# Patient Record
Sex: Female | Born: 1945 | Race: White | Hispanic: No | Marital: Married | Smoking: Former smoker
Health system: Southern US, Community
[De-identification: ages and names within clinical notes are randomized; demographics above are authoritative.]

## PROBLEM LIST (undated history)

## (undated) DIAGNOSIS — T7840XA Allergy, unspecified, initial encounter: Secondary | ICD-10-CM

## (undated) DIAGNOSIS — I10 Essential (primary) hypertension: Secondary | ICD-10-CM

## (undated) DIAGNOSIS — C801 Malignant (primary) neoplasm, unspecified: Secondary | ICD-10-CM

## (undated) DIAGNOSIS — M1990 Unspecified osteoarthritis, unspecified site: Secondary | ICD-10-CM

## (undated) DIAGNOSIS — F419 Anxiety disorder, unspecified: Secondary | ICD-10-CM

## (undated) DIAGNOSIS — E785 Hyperlipidemia, unspecified: Secondary | ICD-10-CM

## (undated) HISTORY — DX: Allergy, unspecified, initial encounter: T78.40XA

## (undated) HISTORY — PX: JOINT REPLACEMENT: SHX530

## (undated) HISTORY — DX: Hyperlipidemia, unspecified: E78.5

## (undated) HISTORY — DX: Essential (primary) hypertension: I10

## (undated) HISTORY — DX: Anxiety disorder, unspecified: F41.9

## (undated) HISTORY — DX: Malignant (primary) neoplasm, unspecified: C80.1

## (undated) HISTORY — PX: ABDOMINAL HYSTERECTOMY: SHX81

## (undated) HISTORY — DX: Unspecified osteoarthritis, unspecified site: M19.90

## (undated) HISTORY — PX: WRIST SURGERY: SHX841

---

## 2012-11-26 DIAGNOSIS — E7800 Pure hypercholesterolemia, unspecified: Secondary | ICD-10-CM | POA: Insufficient documentation

## 2016-10-18 DIAGNOSIS — H9193 Unspecified hearing loss, bilateral: Secondary | ICD-10-CM | POA: Insufficient documentation

## 2016-12-06 ENCOUNTER — Encounter: Payer: Self-pay | Admitting: Student in an Organized Health Care Education/Training Program

## 2016-12-06 ENCOUNTER — Ambulatory Visit
Admission: RE | Admit: 2016-12-06 | Discharge: 2016-12-06 | Disposition: A | Discharge status: Home / Self Care | Payer: Medicare HMO | Source: Ambulatory Visit | Attending: Student in an Organized Health Care Education/Training Program | Admitting: Student in an Organized Health Care Education/Training Program

## 2016-12-06 ENCOUNTER — Ambulatory Visit
Payer: Medicare HMO | Attending: Student in an Organized Health Care Education/Training Program | Admitting: Student in an Organized Health Care Education/Training Program

## 2016-12-06 VITALS — BP 137/95 | HR 70 | Temp 98.1°F | Resp 16 | Ht 64.0 in | Wt 220.0 lb

## 2016-12-06 DIAGNOSIS — M47814 Spondylosis without myelopathy or radiculopathy, thoracic region: Secondary | ICD-10-CM | POA: Diagnosis not present

## 2016-12-06 DIAGNOSIS — M170 Bilateral primary osteoarthritis of knee: Secondary | ICD-10-CM | POA: Diagnosis not present

## 2016-12-06 DIAGNOSIS — M47816 Spondylosis without myelopathy or radiculopathy, lumbar region: Secondary | ICD-10-CM

## 2016-12-06 DIAGNOSIS — I700 Atherosclerosis of aorta: Secondary | ICD-10-CM | POA: Insufficient documentation

## 2016-12-06 DIAGNOSIS — F329 Major depressive disorder, single episode, unspecified: Secondary | ICD-10-CM | POA: Insufficient documentation

## 2016-12-06 DIAGNOSIS — M7918 Myalgia, other site: Secondary | ICD-10-CM

## 2016-12-06 DIAGNOSIS — G894 Chronic pain syndrome: Secondary | ICD-10-CM | POA: Diagnosis present

## 2016-12-06 DIAGNOSIS — K219 Gastro-esophageal reflux disease without esophagitis: Secondary | ICD-10-CM | POA: Insufficient documentation

## 2016-12-06 DIAGNOSIS — M47812 Spondylosis without myelopathy or radiculopathy, cervical region: Secondary | ICD-10-CM | POA: Diagnosis not present

## 2016-12-06 DIAGNOSIS — Z888 Allergy status to other drugs, medicaments and biological substances status: Secondary | ICD-10-CM | POA: Diagnosis not present

## 2016-12-06 DIAGNOSIS — Z79899 Other long term (current) drug therapy: Secondary | ICD-10-CM | POA: Insufficient documentation

## 2016-12-06 DIAGNOSIS — Z87891 Personal history of nicotine dependence: Secondary | ICD-10-CM | POA: Diagnosis not present

## 2016-12-06 DIAGNOSIS — Z90710 Acquired absence of both cervix and uterus: Secondary | ICD-10-CM | POA: Diagnosis not present

## 2016-12-06 DIAGNOSIS — F411 Generalized anxiety disorder: Secondary | ICD-10-CM

## 2016-12-06 DIAGNOSIS — Z96653 Presence of artificial knee joint, bilateral: Secondary | ICD-10-CM | POA: Diagnosis not present

## 2016-12-06 DIAGNOSIS — E785 Hyperlipidemia, unspecified: Secondary | ICD-10-CM | POA: Insufficient documentation

## 2016-12-06 DIAGNOSIS — I129 Hypertensive chronic kidney disease with stage 1 through stage 4 chronic kidney disease, or unspecified chronic kidney disease: Secondary | ICD-10-CM | POA: Diagnosis not present

## 2016-12-06 DIAGNOSIS — Z9889 Other specified postprocedural states: Secondary | ICD-10-CM | POA: Diagnosis not present

## 2016-12-06 DIAGNOSIS — M791 Myalgia: Secondary | ICD-10-CM | POA: Insufficient documentation

## 2016-12-06 MED ORDER — TIZANIDINE HCL 4 MG PO CAPS: 4.0000 mg | ORAL_CAPSULE | Freq: Two times a day (BID) | ORAL | 0 refills | Status: DC | PRN

## 2016-12-06 NOTE — Patient Instructions (Signed)
It was nice meeting you today. Today we did the following:  #1 referral to pain psychology which is standard for new patients #2 urine drug screen today which is tender for new patients #3 scheduled for bilateral genicular nerves block #4 prescription for tizanidine 4 mg twice a day when necessary muscle spasms #5 continue Celebrex 200 mg daily, continue gabapentin 300 mg 3 times a day. #6 we will order cervical, thoracic, lumbar x-rays #7 follow with me within the next month for your genicular nerves block. Follow up with me regarding medication management after your pain psychology evaluation.  As we discussed, if you want to be considered for opioid therapy at this clinic under my care, you cannot take any benzodiazepines or medication such as Soma. This would be grounds to have your opioid therapy weaned and discontinued if you continue this medication. Therefore I prescribed to another medication called tizanidine which may help out with your muscle spasms.

## 2016-12-06 NOTE — Progress Notes (Signed)
Safety precautions to be maintained throughout the outpatient stay will include: orient to surroundings, keep bed in low position, maintain call bell within reach at all times, provide assistance with transfer out of bed and ambulation.  

## 2016-12-06 NOTE — Progress Notes (Signed)
Patient's Name: Cindy Rose  MRN: [id number]  Referring Provider: No ref. provider found  DOB: [birth year]  PCP: Patient, No Pcp Per  DOS: 12/06/2016  Note by: Gillis Santa, MD  Service setting: Ambulatory outpatient  Specialty: Interventional Pain Management  Location: [facility] (AMB) Pain Management Facility  Visit type: Initial Patient Evaluation  Patient type: New Patient   Primary Reason(s) for Visit: Encounter for initial evaluation of one or more chronic problems (new to examiner) potentially causing chronic pain, and posing a threat to normal musculoskeletal function. (Level of risk: High) CC: Knee Pain (bilateral) and Back Pain (lower- bilaterally to buttocks)  HPI  Cindy Rose is a 71 y.o. year old, female patient, who comes today to see [place] for the first time for an initial evaluation of her chronic pain. She  does not have a problem list on file. Today she comes in for evaluation of her Knee Pain (bilateral) and Back Pain (lower- bilaterally to buttocks)  Pain Assessment: Location: Right, Left Knee Radiating: to shin Onset: More than a month ago (had replacent 10 years ago) Duration: Chronic pain Quality: Aching, Stabbing Severity: 7 /10 (self-reported pain score)  Note: Reported level is compatible with observation.                   Effect on ADL: "I dont what I need too" Timing: Constant Modifying factors: rest, medications, heat, cold  Onset and Duration: Gradual and Present longer than 3 months Cause of pain: worked too hard Severity: Getting worse, NAS-11 at its worse: 10/10, NAS-11 at its best: 5/10, NAS-11 now: 7/10 and NAS-11 on the average: 7/10 Timing: During activity or exercise, After activity or exercise and After a period of immobility Aggravating Factors: Climbing, Kneeling, Prolonged sitting, Prolonged standing, Squatting, Surgery made it worse, Walking, Walking uphill, Walking downhill and Working Alleviating Factors: Hot packs and Medications Associated  Problems: Numbness, Spasms, Swelling, Tingling and Weakness Quality of Pain: Aching, Burning, Feeling of constriction, Getting longer, Horrible, Pressure-like, Throbbing and Tingling Previous Examinations or Tests: X-rays and Orthopedic evaluation Previous Treatments: Narcotic medications and Trigger point injections  The patient comes into the clinics today for the first time for a chronic pain management evaluation.  71 y.o. old female with a past medical history significant for bilateral knee replacements who presents with bilateral knee pain. Patient states that she has had bilateral knee replacements done in [place] many years ago, left one was done in 2008 and right one was done in 2007. She states that these knee replacements were done "incorrectly". She states that her knees are misaligned as a result of the surgery.  In addition to her knees the patient also endorses diffuse pain in her bilateral shoulders, hands however endorses that her knee pain is the most debilitating. She works as a Programme researcher, broadcasting/film/video.  Patient does take Soma for muscle spasms. I told her that to be considered for opioid therapy, she must refrain from this medication or benzodiazepines. Patient does have anxiety and depression. She was tearful at times during our conversation.  Today I took the time to provide the patient with information regarding my pain practice. The patient was informed that my practice is divided into two sections: an interventional pain management section, as well as a completely separate and distinct medication management section. I explained that I have procedure days for my interventional therapies, and evaluation days for follow-ups and medication management. Because of the amount of documentation required during both, they are kept separated. This means  that there is the possibility that she may be scheduled for a procedure on one day, and medication management the next. I have also informed her that  because of staffing and facility limitations, I no longer take patients for medication management only. To illustrate the reasons for this, I gave the patient the example of surgeons, and how inappropriate it would be to refer a patient to his/her care, just to write for the post-surgical antibiotics on a surgery done by a different surgeon.   Because interventional pain management is my board-certified specialty, the patient was informed that joining my practice means that they are open to any and all interventional therapies. I made it clear that this does not mean that they will be forced to have any procedures done. What this means is that I believe interventional therapies to be essential part of the diagnosis and proper management of chronic pain conditions. Therefore, patients not interested in these interventional alternatives will be better served under the care of a different practitioner.  The patient was also made aware of my Comprehensive Pain Management Safety Guidelines where by joining my practice, they limit all of their nerve blocks and joint injections to those done by our practice, for as long as we are retained to manage their care.   Historic Controlled Substance Pharmacotherapy Review  PMP and historical list of controlled substances: Norco, 7.5, twice a day to 3 times a day when necessary, last fill quantity 14, 10/26/2016 MME/day: 0 mg/day Medications: The patient did not bring the medication(s) to the appointment, as requested in our "New Patient Package" Pharmacodynamics: Desired effects: Analgesia: The patient reports >50% benefit. Reported improvement in function: The patient reports medication allows her to accomplish basic ADLs. Clinically meaningful improvement in function (CMIF): Sustained CMIF goals met Perceived effectiveness: Described as relatively effective, allowing for increase in activities of daily living (ADL) Undesirable effects: Side-effects or Adverse  reactions: None reported Historical Monitoring: The patient  reports that she does not use drugs. List of all UDS Test(s): No results found for: MDMA, COCAINSCRNUR, PCPSCRNUR, PCPQUANT, CANNABQUANT, THCU, [place] List of all Serum Drug Screening Test(s):  No results found for: AMPHSCRSER, BARBSCRSER, BENZOSCRSER, COCAINSCRSER, PCPSCRSER, PCPQUANT, THCSCRSER, CANNABQUANT, OPIATESCRSER, OXYSCRSER, PROPOXSCRSER Historical Background Evaluation: [place] PDMP: Six (6) year initial data search conducted.             [place] Department of public safety, offender search: Editor, commissioning Information) Non-contributory Risk Assessment Profile: Aberrant behavior: None observed or detected today Risk factors for fatal opioid overdose: None identified today Fatal overdose hazard ratio (HR): Calculation deferred Non-fatal overdose hazard ratio (HR): Calculation deferred Risk of opioid abuse or dependence: 0.7-3.0% with doses ? 36 MME/day and 6.1-26% with doses ? 120 MME/day. Substance use disorder (SUD) risk level: Pending results of Medical Psychology Evaluation for SUD Opioid risk tool (ORT) (Total Score): 2     Opioid Risk Tool - 12/06/16 1426      Family History of Substance Abuse   Alcohol Negative   Illegal Drugs Negative   Rx Drugs Negative     Personal History of Substance Abuse   Alcohol Negative   Illegal Drugs Negative   Rx Drugs Negative     Age   Age between 85-45 years  No     Psychological Disease   Psychological Disease Positive   ADD Positive  No med- states she has it   OCD Negative   Bipolar Negative   Schizophrenia Negative   Depression Negative  Total Score   Opioid Risk Tool Scoring 2   Opioid Risk Interpretation Low Risk     ORT Scoring interpretation table:  Score <3 = Low Risk for SUD  Score between 4-7 = Moderate Risk for SUD  Score >8 = High Risk for Opioid Abuse    Pharmacologic Plan: Pending ordered tests and/or consults            Initial impression: Pending review  of available data and ordered tests.  Meds   Current Meds  Medication Sig  . CALCIUM-MAGNESIUM-ZINC PO Take by mouth.  . celecoxib (CELEBREX) 200 MG capsule TAKE ONE CAPSULE BY MOUTH ONCE DAILY  . gabapentin (NEURONTIN) 300 MG capsule Take 300 mg by mouth 2 (two) times daily.  Marland Kitchen HYDROcodone-acetaminophen (NORCO) 7.5-325 MG tablet Take by mouth.  . naproxen (NAPROSYN) 500 MG tablet Take by mouth.  Marland Kitchen omeprazole (PRILOSEC) 40 MG capsule TAKE ONE CAPSULE BY MOUTH ONCE DAILY  . valsartan-hydrochlorothiazide (DIOVAN-HCT) 160-12.5 MG tablet TAKE ONE TABLET BY MOUTH IN THE EVENING  . [DISCONTINUED] carisoprodol (SOMA) 350 MG tablet TAKE 1 TABLET BY MOUTH EVERY 8 HOURS AS NEEDED    Imaging Review   TECHNIQUE: Two views left knee.  COMPARISON: None.  INDICATION: T84.84XA: Pain due to internal orthopedic prosthetic devices, implants and grafts, initial encounter Z96.659: Presence of unspecified artificial knee joint  FINDINGS:  No acute fracture. Total knee arthroplasty intact. Slight inferior tilt of the medial tibial plateau. Small effusion.  Result Narrative  TECHNIQUE: 2 views right knee.  COMPARISON: None  FINDINGS: Total knee prosthesis is seen. Slight tilt of the patellar component. Tibiofemoral components are unremarkable. The tibial component is secured with screws. May be a small effusion superiorly.  Total knee replacement. Slight tilt of the patella prosthesis, with could be related to an effusion.  Other Result Information  Acute Interface, Incoming Rad Results - 03/24/2015 10:42 AM EST TECHNIQUE: 2 views right knee.  COMPARISON: None  FINDINGS: Total knee prosthesis is seen. Slight tilt of the patellar component. Tibiofemoral components are unremarkable. The tibial component is secured with screws. May be a small effusion superiorly.  Total knee replacement. Slight tilt of the patella prosthesis, with could be related to an effusion.      Complexity Note:  Imaging results reviewed. Results discussed using Layman's terms.               ROS  Cardiovascular History: High blood pressure Pulmonary or Respiratory History: Snoring  Neurological History: No reported neurological signs or symptoms such as seizures, abnormal skin sensations, urinary and/or fecal incontinence, being born with an abnormal open spine and/or a tethered spinal cord Review of Past Neurological Studies: No results found for this or any previous visit. Psychological-Psychiatric History: No reported psychological or psychiatric signs or symptoms such as difficulty sleeping, anxiety, depression, delusions or hallucinations (schizophrenial), mood swings (bipolar disorders) or suicidal ideations or attempts Gastrointestinal History: Reflux or heatburn Genitourinary History: No reported renal or genitourinary signs or symptoms such as difficulty voiding or producing urine, peeing blood, non-functioning kidney, kidney stones, difficulty emptying the bladder, difficulty controlling the flow of urine, or chronic kidney disease Hematological History: No reported hematological signs or symptoms such as prolonged bleeding, low or poor functioning platelets, bruising or bleeding easily, hereditary bleeding problems, low energy levels due to low hemoglobin or being anemic Endocrine History: No reported endocrine signs or symptoms such as high or low blood sugar, rapid heart rate due to high thyroid levels, obesity or weight gain  due to slow thyroid or thyroid disease Rheumatologic History: No reported rheumatological signs and symptoms such as fatigue, joint pain, tenderness, swelling, redness, heat, stiffness, decreased range of motion, with or without associated rash Musculoskeletal History: Negative for myasthenia gravis, muscular dystrophy, multiple sclerosis or malignant hyperthermia Work History: Working full time  Allergies  Ms. Gordner is allergic to tape.  Laboratory Chemistry   Inflammation Markers (CRP: Acute Phase) (ESR: Chronic Phase) No results found for: CRP, ESRSEDRATE               Renal Function Markers No results found for: BUN, CREATININE, GFRAA, GFRNONAA               Hepatic Function Markers No results found for: AST, ALT, ALBUMIN, ALKPHOS, HCVAB               Electrolytes No results found for: NA, K, CL, CALCIUM, MG               Neuropathy Markers No results found for: [id number]               Bone Pathology Markers No results found for: Hendricks Milo, VD125OH2TOT, [id number], [id number], 25OHVITD1, 25OHVITD2, 25OHVITD3, CALCIUM, TESTOFREE, TESTOSTERONE               Coagulation Parameters No results found for: INR, LABPROT, APTT, PLT               Cardiovascular Markers No results found for: BNP, HGB, HCT               Note: Lab results reviewed.  PFSH  Drug: Ms. Borenstein  reports that she does not use drugs. Alcohol:  reports that she does not drink alcohol. Tobacco:  reports that she quit smoking about 10 years ago. Her smoking use included Cigarettes. She has never used smokeless tobacco. Medical:  has a past medical history of Allergy; Anxiety; Arthritis; Hyperlipidemia; and Hypertension. Family: family history includes Cancer in her father; Heart disease in her mother.  Past Surgical History:  Procedure Laterality Date  . ABDOMINAL HYSTERECTOMY    . JOINT REPLACEMENT    . WRIST SURGERY     Active Ambulatory Problems    Diagnosis Date Noted  . No Active Ambulatory Problems   Resolved Ambulatory Problems    Diagnosis Date Noted  . No Resolved Ambulatory Problems   Past Medical History:  Diagnosis Date  . Allergy   . Anxiety   . Arthritis   . Hyperlipidemia   . Hypertension    Constitutional Exam  General appearance: Well nourished, well developed, and well hydrated. In no apparent acute distress Vitals:   12/06/16 1400  BP: (!) 137/95  Pulse: 70  Resp: 16  Temp: 98.1 F (36.7 C)  SpO2: 100%  Weight: 220  lb (99.8 kg)  Height: 5' 4"  (1.626 m)   BMI Assessment: Estimated body mass index is 37.76 kg/m as calculated from the following:   Height as of this encounter: 5' 4"  (1.626 m).   Weight as of this encounter: 220 lb (99.8 kg).  BMI interpretation table: BMI level Category Range association with higher incidence of chronic pain  <18 kg/m2 Underweight   18.5-24.9 kg/m2 Ideal body weight   25-29.9 kg/m2 Overweight Increased incidence by 20%  30-34.9 kg/m2 Obese (Class I) Increased incidence by 68%  35-39.9 kg/m2 Severe obesity (Class II) Increased incidence by 136%  >40 kg/m2 Extreme obesity (Class III) Increased incidence by 254%   BMI Readings from Last  4 Encounters:  12/06/16 37.76 kg/m   Wt Readings from Last 4 Encounters:  12/06/16 220 lb (99.8 kg)  Psych/Mental status: Alert, oriented x 3 (person, place, & time)       Eyes: PERLA Respiratory: No evidence of acute respiratory distress  Cervical Spine Area Exam  Skin & Axial Inspection: No masses, redness, edema, swelling, or associated skin lesions Alignment: Symmetrical Functional ROM: Unrestricted ROM      Stability: No instability detected Muscle Tone/Strength: Functionally intact. No obvious neuro-muscular anomalies detected. Sensory (Neurological): Unimpaired Palpation: No palpable anomalies              Upper Extremity (UE) Exam    Side: Right upper extremity  Side: Left upper extremity  Skin & Extremity Inspection: Skin color, temperature, and hair growth are WNL. No peripheral edema or cyanosis. No masses, redness, swelling, asymmetry, or associated skin lesions. No contractures.  Skin & Extremity Inspection: Skin color, temperature, and hair growth are WNL. No peripheral edema or cyanosis. No masses, redness, swelling, asymmetry, or associated skin lesions. No contractures.  Functional ROM: Unrestricted ROM          Functional ROM: Unrestricted ROM          Muscle Tone/Strength: Functionally intact. No obvious  neuro-muscular anomalies detected.  Muscle Tone/Strength: Functionally intact. No obvious neuro-muscular anomalies detected.  Sensory (Neurological): Unimpaired  Sensory (Neurological): Unimpaired  Palpation: No palpable anomalies              Palpation: No palpable anomalies              Specialized Test(s): Deferred         Specialized Test(s): Deferred          Thoracic Spine Area Exam  Skin & Axial Inspection: No masses, redness, or swelling Alignment: Symmetrical Functional ROM: Unrestricted ROM Stability: No instability detected Muscle Tone/Strength: Functionally intact. No obvious neuro-muscular anomalies detected. Sensory (Neurological): Unimpaired Muscle strength & Tone: No palpable anomalies  Lumbar Spine Area Exam  Skin & Axial Inspection: No masses, redness, or swelling Alignment: Symmetrical Functional ROM: Unrestricted ROM      Stability: No instability detected Muscle Tone/Strength: Functionally intact. No obvious neuro-muscular anomalies detected. Sensory (Neurological): Movement-associated pain Palpation: Positive       Provocative Tests: Lumbar Hyperextension and rotation test: Positive       Lumbar Lateral bending test: Positive       Patrick's Maneuver: Positive                    Gait & Posture Assessment  Ambulation: Unassisted Gait: Relatively normal for age and body habitus Posture: WNL   Lower Extremity Exam    Side: Right lower extremity  Side: Left lower extremity  Skin & Extremity Inspection: Evidence of prior arthroplastic surgery  Skin & Extremity Inspection: Evidence of prior arthroplastic surgery  Functional ROM: Decreased ROM          Functional ROM: Decreased ROM          Muscle Tone/Strength: Functionally intact. No obvious neuro-muscular anomalies detected.  Muscle Tone/Strength: Functionally intact. No obvious neuro-muscular anomalies detected.  Sensory (Neurological): Arthropathic arthralgia  Sensory (Neurological): Arthropathic arthralgia   Palpation: Complains of area being tender to palpation  Palpation: Complains of area being tender to palpation  Limited knee flexion, knee extension bilaterally limited by pain. Medial portion of the patella tender to palpation on right. Medial inferior patellar region tender to palpation.   Assessment  Primary Diagnosis &  Pertinent Problem List: The primary encounter diagnosis was Primary osteoarthritis of both knees. Diagnoses of History of bilateral knee replacement, Anxiety state, Myofascial pain, Chronic pain syndrome, and Lumbar spondylosis were also pertinent to this visit.  Visit Diagnosis (New problems to examiner): 1. Primary osteoarthritis of both knees   2. History of bilateral knee replacement   3. Anxiety state   4. Myofascial pain   5. Chronic pain syndrome   6. Lumbar spondylosis    Plan of Care (Initial workup plan)  Note: Please be advised that as per protocol, today's visit has been an evaluation only. We have not taken over the patient's controlled substance management.   71 year old female with a past medical history of depression, anxiety, chronic pain syndrome secondary to primary osteoarthritis of bilateral knees status post bilateral knee replacement therapy. Patient also endorses axial low back pain which is not as bad as her knee pain. Also endorses bilateral shoulder and hand pain.  Lumbar spine x-ray shows degenerative disc disease and multilevel spondylosis.  I had an extensive discussion about discontinuing Soma with the patient. I told her that if she is to be considered for chronic opioid therapy at this clinic, she must be off of benzodiazepines or medication such as Soma. Patient agrees. I expect her urine toxicology screen be positive for Soma today. Repeat urine toxicology screen at next visit should be negative for Soma.  We'll send the patient to pain psychology for risk of chronic opioid therapy. I will also schedule the patient for bilateral  genicular nerve block under fluoroscopy for her bilateral osteoarthritis of knees.    plan: #1. Continue Celebrex 200 mg daily and gabapentin 300 mg 3 times a day as you have been taking in the past. #2. Pain psychology referral. Urine drug scan today. Should be positive for Soma. Patient states that she will stop Soma. Future urine drug screen should be negative for stroke. #3. Scheduled for bilateral genicular nerves block under fluoroscopy. Patient has had intra-articular knee steroid injections which were not very effective. #4. tizanidine 4 mg twice a day when necessary muscle spasms.  #5. Follow with me for medication management after pain psych evaluation. #6. Cervical thoracic lumbar x-rays.    Ordered Lab-work, Procedure(s), Referral(s), & Consult(s): Orders Placed This Encounter  Procedures  . GENICULAR NERVE BLOCK  . DG Cervical Spine Complete  . DG Thoracic Spine 2 View  . DG Lumbar Spine Complete W/Bend  . Compliance Drug Analysis, Ur  . Ambulatory referral to Psychology   Pharmacotherapy (current): Medications ordered:  Meds ordered this encounter  Medications  . tiZANidine (ZANAFLEX) 4 MG capsule    Sig: Take 1 capsule (4 mg total) by mouth 2 (two) times daily as needed for muscle spasms.    Dispense:  60 capsule    Refill:  0    Do not place this medication, or any other prescription from our practice, on "Automatic Refill". Patient may have prescription filled one day early if pharmacy is closed on scheduled refill date.   Medications administered during this visit: Ms. Rutten had no medications administered during this visit.   Pharmacological management options:  Opioid Analgesics: The patient was informed that there is no guarantee that she would be a candidate for opioid analgesics. The decision will be made following CDC guidelines. This decision will be based on the results of diagnostic studies, as well as Ms. Severe's risk profile.   Membrane  stabilizer: To be determined at a later timeCurrently on gabapentin  300 mg 3 times a day. Can consider going up. Could also trial Lyrica or Topamax.   Muscle relaxant: To be determined at a later timeTrial of tizanidine. Can consider Robaxin, Flexeril, baclofen   NSAID: To be determined at a later timeCurrently on Celebrex. Can consider diclofenac by mouth or Mobic by mouth in the future.   Other analgesic(s): To be determined at a later timeLidocaine ointment, Voltaren gel, Cymbalta, TCA    Interventional management options: Ms. Larsson was informed that there is no guarantee that she would be a candidate for interventional therapies. The decision will be based on the results of diagnostic studies, as well as Ms. Lees's risk profile.  Procedure(s) under consideration:  -Bilateral genicular nerves block -Intra-articular knee steroid injection -Lumbar medial branch blocks, L3-L5 bilaterally -Shoulder joint injection -SI joint injection    Provider-requested follow-up: No Follow-up on file.  No future appointments.  Primary Care Physician: Patient, No Pcp Per Location: [facility] Note by: Gillis Santa, M.D, Date: 12/06/2016; Time: 12:04 PM  Patient Instructions  It was nice meeting you today. Today we did the following:  #1 referral to pain psychology which is standard for new patients #2 urine drug screen today which is tender for new patients #3 scheduled for bilateral genicular nerves block #4 prescription for tizanidine 4 mg twice a day when necessary muscle spasms #5 continue Celebrex 200 mg daily, continue gabapentin 300 mg 3 times a day. #6 we will order cervical, thoracic, lumbar x-rays #7 follow with me within the next month for your genicular nerves block. Follow up with me regarding medication management after your pain psychology evaluation.  As we discussed, if you want to be considered for opioid therapy at this clinic under my care,  you cannot take any benzodiazepines or medication such as Soma. This would be grounds to have your opioid therapy weaned and discontinued if you continue this medication. Therefore I prescribed to another medication called tizanidine which may help out with your muscle spasms.

## 2016-12-10 LAB — COMPLIANCE DRUG ANALYSIS, UR

## 2017-01-04 ENCOUNTER — Ambulatory Visit (HOSPITAL_BASED_OUTPATIENT_CLINIC_OR_DEPARTMENT_OTHER): Payer: Medicare HMO | Admitting: Student in an Organized Health Care Education/Training Program

## 2017-01-04 ENCOUNTER — Encounter: Payer: Self-pay | Admitting: Student in an Organized Health Care Education/Training Program

## 2017-01-04 ENCOUNTER — Ambulatory Visit
Admission: RE | Admit: 2017-01-04 | Discharge: 2017-01-04 | Disposition: A | Discharge status: Home / Self Care | Payer: Medicare HMO | Source: Ambulatory Visit | Attending: Student in an Organized Health Care Education/Training Program | Admitting: Student in an Organized Health Care Education/Training Program

## 2017-01-04 VITALS — BP 117/67 | HR 68 | Temp 98.7°F | Resp 19 | Ht 64.0 in | Wt 220.0 lb

## 2017-01-04 DIAGNOSIS — Z90710 Acquired absence of both cervix and uterus: Secondary | ICD-10-CM | POA: Diagnosis not present

## 2017-01-04 DIAGNOSIS — G894 Chronic pain syndrome: Secondary | ICD-10-CM

## 2017-01-04 DIAGNOSIS — M170 Bilateral primary osteoarthritis of knee: Secondary | ICD-10-CM

## 2017-01-04 DIAGNOSIS — Z91048 Other nonmedicinal substance allergy status: Secondary | ICD-10-CM | POA: Diagnosis not present

## 2017-01-04 DIAGNOSIS — Z9660 Presence of unspecified orthopedic joint implant: Secondary | ICD-10-CM | POA: Insufficient documentation

## 2017-01-04 DIAGNOSIS — Z79899 Other long term (current) drug therapy: Secondary | ICD-10-CM | POA: Diagnosis not present

## 2017-01-04 MED ORDER — LIDOCAINE HCL (PF) 1 % IJ SOLN: 10.0000 mL | Freq: Once | INTRAMUSCULAR | Status: DC

## 2017-01-04 MED ORDER — BACLOFEN 10 MG PO TABS: 10.0000 mg | ORAL_TABLET | Freq: Three times a day (TID) | ORAL | 1 refills | Status: DC | PRN

## 2017-01-04 MED ORDER — LIDOCAINE HCL (PF) 1.5 % IJ SOLN
20.0000 mL | Freq: Once | INTRAMUSCULAR | Status: DC
  Filled 2017-01-04: qty 20

## 2017-01-04 MED ORDER — ROPIVACAINE HCL 2 MG/ML IJ SOLN
9.0000 mL | Freq: Once | INTRAMUSCULAR | Status: AC
  Administered 2017-01-04: 10 mL
  Filled 2017-01-04: qty 10

## 2017-01-04 NOTE — Progress Notes (Signed)
Safety precautions to be maintained throughout the outpatient stay will include: orient to surroundings, keep bed in low position, maintain call bell within reach at all times, provide assistance with transfer out of bed and ambulation.  

## 2017-01-04 NOTE — Patient Instructions (Signed)

## 2017-01-04 NOTE — Progress Notes (Signed)
Patient's Name: Cindy Rose  MRN: [id number]  Referring Provider: Gillis Santa, MD  DOB: [birth year]  PCP: Patient, No Pcp Per  DOS: 01/04/2017  Note by: Gillis Santa, MD  Service setting: Ambulatory outpatient  Specialty: Interventional Pain Management  Patient type: Established  Location: [facility] (AMB) Pain Management Facility  Visit type: Interventional Procedure   Primary Reason for Visit: Interventional Pain Management Treatment. CC: Knee Pain (bilateral)  Procedure:  Anesthesia, Analgesia, Anxiolysis:  Type: Therapeutic Superior-lateral, Superior-medial, and Inferior-medial, Genicular Nerves Block. (CPT [phone]) Region: Lateral, Anterior, and Medial aspects of the knee joint, above and below the knee joint proper. Level: Superior and inferior to the knee joint. Laterality: Bilateral  Type: Local Anesthesia Local Anesthetic: Lidocaine 1% Route: Infiltration ([place]/IM) IV Access: Secured Sedation: Meaningful verbal contact was maintained at all times during the procedure  Indication(s): Analgesia and Anxiety  Indications: 1. Primary osteoarthritis of both knees    Pain Score: Pre-procedure: 6 /10 Post-procedure: 0-No pain/10  Pre-op Assessment:  Cindy Rose is a 71 y.o. (year old), female patient, seen today for interventional treatment. She  has a past surgical history that includes Joint replacement; Abdominal hysterectomy; and Wrist surgery. Cindy Rose has a current medication list which includes the following prescription(s): calcium-magnesium-zinc, celecoxib, gabapentin, hydrocodone-acetaminophen, naproxen, omeprazole, valsartan-hydrochlorothiazide, and baclofen, and the following Facility-Administered Medications: lidocaine. Her primarily concern today is the Knee Pain (bilateral)  Initial Vital Signs: There were no vitals taken for this visit. BMI: Estimated body mass index is 37.76 kg/m as calculated from the following:   Height as of this encounter: 5\' 4"  (1.626 m).    Weight as of this encounter: 220 lb (99.8 kg).  Risk Assessment: Allergies: Reviewed. She is allergic to tape.  Allergy Precautions: None required Coagulopathies: Reviewed. None identified.  Blood-thinner therapy: None at this time Active Infection(s): Reviewed. None identified. Cindy Rose is afebrile  Site Confirmation: Cindy Rose was asked to confirm the procedure and laterality before marking the site Procedure checklist: Completed Consent: Before the procedure and under the influence of no sedative(s), amnesic(s), or anxiolytics, the patient was informed of the treatment options, risks and possible complications. To fulfill our ethical and legal obligations, as recommended by the American Medical Association's Code of Ethics, I have informed the patient of my clinical impression; the nature and purpose of the treatment or procedure; the risks, benefits, and possible complications of the intervention; the alternatives, including doing nothing; the risk(s) and benefit(s) of the alternative treatment(s) or procedure(s); and the risk(s) and benefit(s) of doing nothing. The patient was provided information about the general risks and possible complications associated with the procedure. These may include, but are not limited to: failure to achieve desired goals, infection, bleeding, organ or nerve damage, allergic reactions, paralysis, and death. In addition, the patient was informed of those risks and complications associated to the procedure, such as failure to decrease pain; infection; bleeding; organ or nerve damage with subsequent damage to sensory, motor, and/or autonomic systems, resulting in permanent pain, numbness, and/or weakness of one or several areas of the body; allergic reactions; (i.e.: anaphylactic reaction); and/or death. Furthermore, the patient was informed of those risks and complications associated with the medications. These include, but are not limited to: allergic  reactions (i.e.: anaphylactic or anaphylactoid reaction(s)); adrenal axis suppression; blood sugar elevation that in diabetics may result in ketoacidosis or comma; water retention that in patients with history of congestive heart failure may result in shortness of breath, pulmonary edema, and decompensation with resultant heart failure;  weight gain; swelling or edema; medication-induced neural toxicity; particulate matter embolism and blood vessel occlusion with resultant organ, and/or nervous system infarction; and/or aseptic necrosis of one or more joints. Finally, the patient was informed that Medicine is not an exact science; therefore, there is also the possibility of unforeseen or unpredictable risks and/or possible complications that may result in a catastrophic outcome. The patient indicated having understood very clearly. We have given the patient no guarantees and we have made no promises. Enough time was given to the patient to ask questions, all of which were answered to the patient's satisfaction. Cindy Rose has indicated that she wanted to continue with the procedure. Attestation: I, the ordering provider, attest that I have discussed with the patient the benefits, risks, side-effects, alternatives, likelihood of achieving goals, and potential problems during recovery for the procedure that I have provided informed consent. Date: 01/04/2017; Time: 1:21 PM  Pre-Procedure Preparation:  Monitoring: As per clinic protocol. Respiration, ETCO2, SpO2, BP, heart rate and rhythm monitor placed and checked for adequate function Safety Precautions: Patient was assessed for positional comfort and pressure points before starting the procedure. Time-out: I initiated and conducted the "Time-out" before starting the procedure, as per protocol. The patient was asked to participate by confirming the accuracy of the "Time Out" information. Verification of the correct person, site, and procedure were performed  and confirmed by me, the nursing staff, and the patient. "Time-out" conducted as per Joint Commission's Universal Protocol (UP.01.01.01). "Time-out" Date & Time: 01/04/2017; 1354 hrs.  Description of Procedure Process:  Position: Supine Target Area: For Genicular Nerve block(s), the targets are: the superior-lateral genicular nerve, located in the lateral distal portion of the femoral shaft as it curves to form the lateral epicondyle, in the region of the distal femoral metaphysis; the superior-medial genicular nerve, located in the medial distal portion of the femoral shaft as it curves to form the medial epicondyle; and the inferior-medial genicular nerve, located in the medial, proximal portion of the tibial shaft, as it curves to form the medial epicondyle, in the region of the proximal tibial metaphysis. Approach: Anterior, ipsilateral approach. Area Prepped: Entire knee area, from mid-thigh to mid-shin, lateral, anterior, and medial aspects. Prepping solution: ChloraPrep (2% chlorhexidine gluconate and 70% isopropyl alcohol) Safety Precautions: Aspiration looking for blood return was conducted prior to all injections. At no point did we inject any substances, as a needle was being advanced. No attempts were made at seeking any paresthesias. Safe injection practices and needle disposal techniques used. Medications properly checked for expiration dates. SDV (single dose vial) medications used. Latex Allergy precautions taken.   Description of the Procedure: Protocol guidelines were followed. The patient was placed in position over the procedure table. The target area was identified and the area prepped in the usual manner. Skin desensitized using vapocoolant spray. Skin & deeper tissues infiltrated with local anesthetic. Appropriate amount of time allowed to pass for local anesthetics to take effect. The procedure needles were then advanced to the target area. Proper needle placement secured. Negative  aspiration confirmed. Solution injected in intermittent fashion, asking for systemic symptoms every 0.5cc of injectate. The needles were then removed and the area cleansed, making sure to leave some of the prepping solution back to take advantage of its long term bactericidal properties. Vitals:   01/04/17 1410 01/04/17 1415 01/04/17 1420 01/04/17 1424  BP: 127/71 133/75 135/62 117/67  Pulse:      Resp: 18 16 17 19   Temp:  TempSrc:      SpO2: 100% 100% 100% 95%  Weight:      Height:        Start Time: 1356 hrs. End Time: 1421 hrs. Materials:  Needle(s) Type: Regular needle Gauge: 22G Length: 3.5-in Medication(s): We administered ropivacaine (PF) 2 mg/mL (0.2%). Please see chart orders for dosing details. 1 cc of 0.2% ropivacaine at each level. Imaging Guidance (Non-Spinal):  Type of Imaging Technique: Fluoroscopy Guidance (Non-Spinal) Indication(s): Assistance in needle guidance and placement for procedures requiring needle placement in or near specific anatomical locations not easily accessible without such assistance. Exposure Time: Please see nurses notes. Contrast: Before injecting any contrast, we confirmed that the patient did not have an allergy to iodine, shellfish, or radiological contrast. Once satisfactory needle placement was completed at the desired level, radiological contrast was injected. Contrast injected under live fluoroscopy. No contrast complications. See chart for type and volume of contrast used. Fluoroscopic Guidance: I was personally present during the use of fluoroscopy. "Tunnel Vision Technique" used to obtain the best possible view of the target area. Parallax error corrected before commencing the procedure. "Direction-depth-direction" technique used to introduce the needle under continuous pulsed fluoroscopy. Once target was reached, antero-posterior, oblique, and lateral fluoroscopic projection used confirm needle placement in all planes. Images permanently  stored in EMR. Interpretation: I personally interpreted the imaging intraoperatively. Adequate needle placement confirmed in multiple planes. Appropriate spread of contrast into desired area was observed. No evidence of afferent or efferent intravascular uptake. Permanent images saved into the patient's record.  Antibiotic Prophylaxis:  Indication(s): None identified Antibiotic given: None  Post-operative Assessment:  EBL: None Complications: No immediate post-treatment complications observed by team, or reported by patient. Note: The patient tolerated the entire procedure well. A repeat set of vitals were taken after the procedure and the patient was kept under observation following institutional policy, for this type of procedure. Post-procedural neurological assessment was performed, showing return to baseline, prior to discharge. The patient was provided with post-procedure discharge instructions, including a section on how to identify potential problems. Should any problems arise concerning this procedure, the patient was given instructions to immediately contact us, at any time, without hesitation. In any case, we plan to contact the patient by telephone for a follow-up status report regarding this interventional procedure. Comments:  No additional relevant information.  Plan of Care  Disposition: Discharge home  Discharge Date & Time: 01/04/2017; 1439 hrs.  5 out of 5 strength bilateral lower extremities upon discharge: Plantar flexion, dorsiflexion, knee extension, knee flexion.  Patient also states that tizanidine not helpful. We will prescribe baclofen 10 mg 3 times a day when necessary  Patient awaiting pain psychology appointment. Hopefully she will be able to complete this prior to her next visit with me.  Physician-requested Follow-up:  Return in about 2 weeks (around 01/18/2017) for Post Procedure Evaluation.  Future Appointments Date Time Provider [place]  01/17/2017  11:45 AM Gillis Santa, MD [facility] None  02/08/2017 3:00 PM Bacigalupo, Dionne Bucy, MD [facility] None    Imaging Orders     DG C-Arm 1-60 Min-No Report Procedure Orders    No procedure(s) ordered today    Medications ordered for procedure: Meds ordered this encounter  Medications  . lidocaine 1.5 % injection 20 mL    From block tray.  . ropivacaine (PF) 2 mg/mL (0.2%) (NAROPIN) injection 9 mL  . baclofen (LIORESAL) 10 MG tablet    Sig: Take 1 tablet (10 mg total) by mouth 3 (  three) times daily as needed for muscle spasms.    Dispense:  90 tablet    Refill:  1    Do not place this medication, or any other prescription from our practice, on "Automatic Refill". Patient may have prescription filled one day early if pharmacy is closed on scheduled refill date.   Medications administered: We administered ropivacaine (PF) 2 mg/mL (0.2%).  See the medical record for exact dosing, route, and time of administration.  New Prescriptions   BACLOFEN (LIORESAL) 10 MG TABLET    Take 1 tablet (10 mg total) by mouth 3 (three) times daily as needed for muscle spasms.   Primary Care Physician: Patient, No Pcp Per Location: [facility] Note by: Gillis Santa, MD Date: 01/04/2017; Time: 4:00 PM  Disclaimer:  Medicine is not an exact science. The only guarantee in medicine is that nothing is guaranteed. It is important to note that the decision to proceed with this intervention was based on the information collected from the patient. The Data and conclusions were drawn from the patient's questionnaire, the interview, and the physical examination. Because the information was provided in large part by the patient, it cannot be guaranteed that it has not been purposely or unconsciously manipulated. Every effort has been made to obtain as much relevant data as possible for this evaluation. It is important to note that the conclusions that lead to this procedure are derived in large  part from the available data. Always take into account that the treatment will also be dependent on availability of resources and existing treatment guidelines, considered by other Pain Management Practitioners as being common knowledge and practice, at the time of the intervention. For Medico-Legal purposes, it is also important to point out that variation in procedural techniques and pharmacological choices are the acceptable norm. The indications, contraindications, technique, and results of the above procedure should only be interpreted and judged by a Board-Certified Interventional Pain Specialist with extensive familiarity and expertise in the same exact procedure and technique.

## 2017-01-17 ENCOUNTER — Encounter: Payer: Self-pay | Admitting: Student in an Organized Health Care Education/Training Program

## 2017-01-17 ENCOUNTER — Ambulatory Visit
Payer: Medicare HMO | Attending: Student in an Organized Health Care Education/Training Program | Admitting: Student in an Organized Health Care Education/Training Program

## 2017-01-17 ENCOUNTER — Telehealth: Payer: Self-pay | Admitting: *Deleted

## 2017-01-17 VITALS — BP 119/59 | HR 65 | Temp 98.2°F | Resp 18 | Ht 64.0 in | Wt 220.0 lb

## 2017-01-17 DIAGNOSIS — M170 Bilateral primary osteoarthritis of knee: Secondary | ICD-10-CM | POA: Diagnosis not present

## 2017-01-17 DIAGNOSIS — G894 Chronic pain syndrome: Secondary | ICD-10-CM | POA: Insufficient documentation

## 2017-01-17 DIAGNOSIS — M8588 Other specified disorders of bone density and structure, other site: Secondary | ICD-10-CM | POA: Diagnosis not present

## 2017-01-17 DIAGNOSIS — F411 Generalized anxiety disorder: Secondary | ICD-10-CM | POA: Insufficient documentation

## 2017-01-17 DIAGNOSIS — K219 Gastro-esophageal reflux disease without esophagitis: Secondary | ICD-10-CM | POA: Diagnosis not present

## 2017-01-17 DIAGNOSIS — Z96653 Presence of artificial knee joint, bilateral: Secondary | ICD-10-CM | POA: Insufficient documentation

## 2017-01-17 DIAGNOSIS — M25562 Pain in left knee: Secondary | ICD-10-CM | POA: Diagnosis present

## 2017-01-17 DIAGNOSIS — Z87891 Personal history of nicotine dependence: Secondary | ICD-10-CM | POA: Diagnosis not present

## 2017-01-17 DIAGNOSIS — M1990 Unspecified osteoarthritis, unspecified site: Secondary | ICD-10-CM | POA: Insufficient documentation

## 2017-01-17 DIAGNOSIS — M7918 Myalgia, other site: Secondary | ICD-10-CM | POA: Diagnosis not present

## 2017-01-17 DIAGNOSIS — M25561 Pain in right knee: Secondary | ICD-10-CM | POA: Insufficient documentation

## 2017-01-17 DIAGNOSIS — M8580 Other specified disorders of bone density and structure, unspecified site: Secondary | ICD-10-CM | POA: Insufficient documentation

## 2017-01-17 DIAGNOSIS — H9193 Unspecified hearing loss, bilateral: Secondary | ICD-10-CM | POA: Diagnosis not present

## 2017-01-17 DIAGNOSIS — E7800 Pure hypercholesterolemia, unspecified: Secondary | ICD-10-CM | POA: Insufficient documentation

## 2017-01-17 DIAGNOSIS — I10 Essential (primary) hypertension: Secondary | ICD-10-CM | POA: Insufficient documentation

## 2017-01-17 DIAGNOSIS — M47816 Spondylosis without myelopathy or radiculopathy, lumbar region: Secondary | ICD-10-CM | POA: Insufficient documentation

## 2017-01-17 NOTE — Progress Notes (Signed)
Patient's Name: Cindy Rose  MRN: [id number]  Referring Provider: No ref. provider found  DOB: [birth year]  PCP: Cindy Rose  DOS: 01/17/2017  Note by: Gillis Santa, MD  Service setting: Ambulatory outpatient  Specialty: Interventional Pain Management  Location: [facility] (AMB) Pain Management Facility    Patient type: Established   Primary Reason(s) for Visit: Encounter for post-procedure evaluation of chronic illness with mild to moderate exacerbation CC: Knee Pain (bilateral, left is worse today)  HPI  [name] is a 71 y.o. year old, female patient, who comes today for a post-procedure evaluation. She has Primary osteoarthritis of both knees; Chronic pain syndrome; History of bilateral knee replacement; Anxiety state; Myofascial pain; Lumbar spondylosis; Arthritis; Bilateral hearing loss; Essential hypertension; GERD (gastroesophageal reflux disease); Hypercholesterolemia; Osteopenia; and Spondylosis of lumbar region without myelopathy or radiculopathy on her problem list. Her primarily concern today is the Knee Pain (bilateral, left is worse today)  Pain Assessment: Location: Right, Left (left is worse) Knee Radiating:   Onset: More than a month ago Duration: Chronic pain Quality: Aching, Stabbing Severity: 4 /10 (self-reported pain score)  Note: Reported level is compatible with observation.                   When using our objective Pain Scale, levels between 6 and 10/10 are said to belong in an emergency room, as it progressively worsens from a 6/10, described as severely limiting, requiring emergency care not usually available at an outpatient pain management facility. At a 6/10 level, communication becomes difficult and requires great effort. Assistance to reach the emergency department may be required. Facial flushing and profuse sweating along with potentially dangerous increases in heart rate and blood pressure will be evident. Effect on ADL:   Timing:  Intermittent Modifying factors: rest, heat, meds  [name] comes in today for post-procedure evaluation after the treatment done on 01/04/2017.  Further details on both, my assessment(s), as well as the proposed treatment plan, please see below.  Post-Procedure Assessment  01/04/2017 Procedure: bilateral genicular nerve block Pre-procedure pain score:  6/10 Post-procedure pain score: 0/10 (100% relief) Influential Factors: BMI: 37.76 kg/m Intra-procedural challenges: None observed.         Assessment challenges: None detected.              Reported side-effects: None.        Post-procedural adverse reactions or complications: None reported         Sedation: Please see nurses note. When no sedatives are used, the analgesic levels obtained are directly associated to the effectiveness of the local anesthetics. However, when sedation is provided, the level of analgesia obtained during the initial 1 hour following the intervention, is believed to be the result of a combination of factors. These factors may include, but are not limited to: 1. The effectiveness of the local anesthetics used. 2. The effects of the analgesic(s) and/or anxiolytic(s) used. 3. The degree of discomfort experienced by the patient at the time of the procedure. 4. The patients ability and reliability in recalling and recording the events. 5. The presence and influence of possible secondary gains and/or psychosocial factors. Reported result: Relief experienced during the 1st hour after the procedure: 100 % (Ultra-Short Term Relief)            Interpretative annotation: Clinically appropriate result. Analgesia during this period is likely to be Local Anesthetic and/or IV Sedative (Analgesic/Anxiolytic) related.          Effects of local  anesthetic: The analgesic effects attained during this period are directly associated to the localized infiltration of local anesthetics and therefore cary significant diagnostic value  as to the etiological location, or anatomical origin, of the pain. Expected duration of relief is directly dependent on the pharmacodynamics of the local anesthetic used. Long-acting (4-6 hours) anesthetics used.  Reported result: Relief during the next 4 to 6 hour after the procedure: 100 % (Short-Term Relief)            Interpretative annotation: Clinically appropriate result. Analgesia during this period is likely to be Local Anesthetic-related.          Long-term benefit: Defined as the period of time past the expected duration of local anesthetics (1 hour for short-acting and 4-6 hours for long-acting). With the possible exception of prolonged sympathetic blockade from the local anesthetics, benefits during this period are typically attributed to, or associated with, other factors such as analgesic sensory neuropraxia, antiinflammatory effects, or beneficial biochemical changes provided by agents other than the local anesthetics.  Reported result: Extended relief following procedure: 0 % (Long-Term Relief)            Interpretative annotation: Clinically appropriate result. Good relief. No permanent benefit expected. Inflammation plays a part in the etiology to the pain.          Current benefits: Defined as reported results that persistent at this point in time.   Analgesia: 0-25 %            Function: Somewhat improved ROM: Somewhat improved Interpretative annotation: Recurrence of symptoms. No permanent benefit expected. Effective diagnostic intervention.          Interpretation: Results would suggest a successful diagnostic intervention.                  Plan:  Please see "Plan of Care" for details.       Laboratory Chemistry  Inflammation Markers (CRP: Acute Phase) (ESR: Chronic Phase) No results found for: CRP, ESRSEDRATE               Renal Function Markers No results found for: BUN, CREATININE, GFRAA, GFRNONAA               Hepatic Function Markers No results found for: AST,  ALT, ALBUMIN, ALKPHOS, HCVAB               Electrolytes No results found for: NA, K, CL, CALCIUM, MG               Neuropathy Markers No results found for: [id number]               Bone Pathology Markers No results found for: Hendricks Milo, VD125OH2TOT, [id number], [id number], 25OHVITD1, 25OHVITD2, 25OHVITD3, CALCIUM, TESTOFREE, TESTOSTERONE               Coagulation Parameters No results found for: INR, LABPROT, APTT, PLT               Cardiovascular Markers No results found for: BNP, HGB, HCT               Note: Lab results reviewed.  Recent Diagnostic Imaging Results  DG C-Arm 1-60 Min-No Report Fluoroscopy was utilized by the requesting physician.  No radiographic  interpretation.   Note: Imaging results reviewed.        Meds   Current Outpatient Prescriptions:  .  atorvastatin (LIPITOR) 40 MG tablet, Take 40 mg by mouth., Disp: , Rfl:  .  baclofen (LIORESAL) 10  MG tablet, Take 1 tablet (10 mg total) by mouth 3 (three) times daily as needed for muscle spasms., Disp: 90 tablet, Rfl: 1 .  CALCIUM-MAGNESIUM-ZINC PO, Take by mouth., Disp: , Rfl:  .  celecoxib (CELEBREX) 200 MG capsule, TAKE ONE CAPSULE BY MOUTH ONCE DAILY, Disp: , Rfl:  .  gabapentin (NEURONTIN) 300 MG capsule, Take 300 mg by mouth 2 (two) times daily., Disp: , Rfl:  .  HYDROcodone-acetaminophen (NORCO) 7.5-325 MG tablet, Take by mouth., Disp: , Rfl:  .  naproxen (NAPROSYN) 500 MG tablet, Take by mouth., Disp: , Rfl:  .  omeprazole (PRILOSEC) 40 MG capsule, TAKE ONE CAPSULE BY MOUTH ONCE DAILY, Disp: , Rfl:  .  valsartan-hydrochlorothiazide (DIOVAN-HCT) 160-12.5 MG tablet, TAKE ONE TABLET BY MOUTH IN THE EVENING, Disp: , Rfl:  .  omeprazole (PRILOSEC) 40 MG capsule, Take 40 mg by mouth., Disp: , Rfl:  .  valsartan-hydrochlorothiazide (DIOVAN-HCT) 160-12.5 MG tablet, Take by mouth., Disp: , Rfl:   ROS  Constitutional: Denies any fever or chills Gastrointestinal: No reported hemesis, hematochezia, vomiting,  or acute GI distress Musculoskeletal: Denies any acute onset joint swelling, redness, loss of ROM, or weakness Neurological: No reported episodes of acute onset apraxia, aphasia, dysarthria, agnosia, amnesia, paralysis, loss of coordination, or loss of consciousness  Allergies  Ms. Wantz is allergic to tape.  PFSH  Drug: Ms. Ormiston  reports that she does not use drugs. Alcohol:  reports that she does not drink alcohol. Tobacco:  reports that she quit smoking about 10 years ago. Her smoking use included Cigarettes. She has never used smokeless tobacco. Medical:  has a past medical history of Allergy; Anxiety; Arthritis; Hyperlipidemia; and Hypertension. Surgical: Ms. Inch  has a past surgical history that includes Joint replacement; Abdominal hysterectomy; and Wrist surgery. Family: family history includes Cancer in her father; Heart disease in her mother.  Constitutional Exam  General appearance: Well nourished, well developed, and well hydrated. In no apparent acute distress Vitals:   01/17/17 1146 01/17/17 1149  BP:  (!) 119/59  Pulse: (!) 58 65  Resp: 18   Temp: 98.2 F (36.8 C)   SpO2: 99% 98%  Weight: 220 lb (99.8 kg)   Height: '5\' 4"'$  (1.626 m)    BMI Assessment: Estimated body mass index is 37.76 kg/m as calculated from the following:   Height as of this encounter: '5\' 4"'$  (1.626 m).   Weight as of this encounter: 220 lb (99.8 kg).  BMI interpretation table: BMI level Category Range association with higher incidence of chronic pain  <18 kg/m2 Underweight   18.5-24.9 kg/m2 Ideal body weight   25-29.9 kg/m2 Overweight Increased incidence by 20%  30-34.9 kg/m2 Obese (Class I) Increased incidence by 68%  35-39.9 kg/m2 Severe obesity (Class II) Increased incidence by 136%  >40 kg/m2 Extreme obesity (Class III) Increased incidence by 254%   BMI Readings from Last 4 Encounters:  01/17/17 37.76 kg/m  01/04/17 37.76 kg/m  12/06/16 37.76 kg/m   Wt Readings from  Last 4 Encounters:  01/17/17 220 lb (99.8 kg)  01/04/17 220 lb (99.8 kg)  12/06/16 220 lb (99.8 kg)  Psych/Mental status: Alert, oriented x 3 (person, place, & time)       Eyes: PERLA Respiratory: No evidence of acute respiratory distress  Cervical Spine Area Exam  Skin & Axial Inspection: No masses, redness, edema, swelling, or associated skin lesions Alignment: Symmetrical Functional ROM: Unrestricted ROM      Stability: No instability detected Muscle Tone/Strength: Functionally intact.  No obvious neuro-muscular anomalies detected. Sensory (Neurological): Unimpaired Palpation: No palpable anomalies              Upper Extremity (UE) Exam    Side: Right upper extremity  Side: Left upper extremity  Skin & Extremity Inspection: Skin color, temperature, and hair growth are WNL. No peripheral edema or cyanosis. No masses, redness, swelling, asymmetry, or associated skin lesions. No contractures.  Skin & Extremity Inspection: Skin color, temperature, and hair growth are WNL. No peripheral edema or cyanosis. No masses, redness, swelling, asymmetry, or associated skin lesions. No contractures.  Functional ROM: Unrestricted ROM          Functional ROM: Unrestricted ROM          Muscle Tone/Strength: Functionally intact. No obvious neuro-muscular anomalies detected.  Muscle Tone/Strength: Functionally intact. No obvious neuro-muscular anomalies detected.  Sensory (Neurological): Unimpaired          Sensory (Neurological): Unimpaired          Palpation: No palpable anomalies              Palpation: No palpable anomalies              Specialized Test(s): Deferred         Specialized Test(s): Deferred          Thoracic Spine Area Exam  Skin & Axial Inspection: No masses, redness, or swelling Alignment: Symmetrical Functional ROM: Unrestricted ROM Stability: No instability detected Muscle Tone/Strength: Functionally intact. No obvious neuro-muscular anomalies detected. Sensory (Neurological):  Unimpaired Muscle strength & Tone: No palpable anomalies  Lumbar Spine Area Exam  Skin & Axial Inspection: No masses, redness, or swelling Alignment: Symmetrical Functional ROM: Unrestricted ROM      Stability: No instability detected Muscle Tone/Strength: Functionally intact. No obvious neuro-muscular anomalies detected. Sensory (Neurological): Unimpaired Palpation: No palpable anomalies       Provocative Tests: Lumbar Hyperextension and rotation test: evaluation deferred today       Lumbar Lateral bending test: evaluation deferred today       Patrick's Maneuver: evaluation deferred today                    Gait & Posture Assessment  Ambulation: Unassisted Gait: Relatively normal for age and body habitus Posture: WNL   Lower Extremity Exam    Side: Right lower extremity  Side: Left lower extremity  Skin & Extremity Inspection: Skin color, temperature, and hair growth are WNL. No peripheral edema or cyanosis. No masses, redness, swelling, asymmetry, or associated skin lesions. No contractures.  Skin & Extremity Inspection: Skin color, temperature, and hair growth are WNL. No peripheral edema or cyanosis. No masses, redness, swelling, asymmetry, or associated skin lesions. No contractures.  Functional ROM: Impaired ROM          Functional ROM: Improved after treatment          Muscle Tone/Strength: Functionally intact. No obvious neuro-muscular anomalies detected.  Muscle Tone/Strength: Functionally intact. No obvious neuro-muscular anomalies detected.  Sensory (Neurological): Improved  Sensory (Neurological): Improved  Palpation: No palpable anomalies  Palpation: No palpable anomalies   Assessment  Primary Diagnosis & Pertinent Problem List: The primary encounter diagnosis was Primary osteoarthritis of both knees. Diagnoses of Chronic pain syndrome, History of bilateral knee replacement, Anxiety state, Myofascial pain, and Lumbar spondylosis were also pertinent to this visit.  Status  Diagnosis  Responding Controlled Controlled 1. Primary osteoarthritis of both knees   2. Chronic pain syndrome   3. History  of bilateral knee replacement   4. Anxiety state   5. Myofascial pain   6. Lumbar spondylosis      71 y.o. old female with a past medical history significant for bilateral knee replacements who presents with bilateral knee pain. Patient states that she has had bilateral knee replacements done in [place] many years ago, left one was done in 2008 and right one was done in 2007. She states that these knee replacements were done "incorrectly". She states that her knees are misaligned as a result of the surgery.  Patient is status post bilateral genicular nerve block for bilateral knee pain. She states that the block was effective for approximately 2 weeks but now she has had return of pain to pre-block levels. She states that during this 2 weeks for range of motion of bilateral legs was improved and she was able to stand up for longer period of time.Future considerations could include repeat genicular nerve block with steroid and/or radiofrequency ablation of bilateral genicular nerves.  Patient still has not seen the pain psychologist yet. She states that she was not contacted. We will try and coordinate this referral today so that she can see pain psych before her next appointment so that we can discuss appropriate medication therapy.  Plan: -Continue Celebrex 200 mg daily, gabapentin 300 mg 3 times a day. -encourage patient to see pain psychology so that we could discuss medication regimen at next visit -Follow up in approx 1 month    Pharmacological management options:  Opioid Analgesics: The patient was informed that there is no guarantee that she would be a candidate for opioid analgesics. The decision will be made following CDC guidelines. This decision will be based on the results of diagnostic studies, as well as Ms. Palmateer's risk profile.   Membrane  stabilizer: To be determined at a later timeCurrently on gabapentin 300 mg 3 times a day. Can consider going up. Could also trial Lyrica or Topamax.   Muscle relaxant: To be determined at a later timeTrial of tizanidine. Can consider Robaxin, Flexeril, baclofen   NSAID: To be determined at a later timeCurrently on Celebrex. Can consider diclofenac by mouth or Mobic by mouth in the future.   Other analgesic(s): To be determined at a later timeLidocaine ointment, Voltaren gel, Cymbalta, TCA    Interventional management options: Ms. Mcenery was informed that there is no guarantee that she would be a candidate for interventional therapies. The decision will be based on the results of diagnostic studies, as well as Ms. Taglieri's risk profile.  Procedure(s) under consideration:  -Bilateral genicular nerves block (first one 9/19 with >80% pain relief for approx 10-14 days), consider repeating with steroid or RFA -Intra-articular knee steroid injection -Lumbar medial branch blocks, L3-L5 bilaterally -bilateral cervical medial branch nerve blocks -Shoulder joint injection -SI joint injection     Provider-requested follow-up: Return in about 5 weeks (around 02/21/2017).  Future Appointments Date Time Provider [place]  02/21/2017 11:45 AM Gillis Santa, MD [facility] None    Primary Care Physician: Cindy Rose Location: [facility] Note by: Gillis Santa, M.D Date: 01/17/2017; Time: 12:54 PM  Patient Instructions  1. Follow up 11/6 (same time as spouse's appointment - 3762 or 1230 given driving distance) 2. Please be sure to contact Pain Psychology so that we discuss medication options for your pain at the next visit.

## 2017-01-17 NOTE — Progress Notes (Signed)
Safety precautions to be maintained throughout the outpatient stay will include: orient to surroundings, keep bed in low position, maintain call bell within reach at all times, provide assistance with transfer out of bed and ambulation.  

## 2017-01-17 NOTE — Patient Instructions (Signed)
1. Follow up 11/6 (same time as spouse's appointment - 1638 or 1230 given driving distance) 2. Please be sure to contact Pain Psychology so that we discuss medication options for your pain at the next visit.

## 2017-02-08 ENCOUNTER — Ambulatory Visit: Payer: Self-pay | Admitting: Family Medicine

## 2017-02-21 ENCOUNTER — Ambulatory Visit
Payer: Medicare HMO | Attending: Student in an Organized Health Care Education/Training Program | Admitting: Student in an Organized Health Care Education/Training Program

## 2017-02-21 ENCOUNTER — Encounter: Payer: Self-pay | Admitting: Student in an Organized Health Care Education/Training Program

## 2017-02-21 VITALS — BP 131/60 | HR 64 | Temp 98.3°F | Resp 18 | Ht 64.0 in | Wt 220.0 lb

## 2017-02-21 DIAGNOSIS — M7918 Myalgia, other site: Secondary | ICD-10-CM | POA: Insufficient documentation

## 2017-02-21 DIAGNOSIS — Z87891 Personal history of nicotine dependence: Secondary | ICD-10-CM | POA: Diagnosis not present

## 2017-02-21 DIAGNOSIS — I10 Essential (primary) hypertension: Secondary | ICD-10-CM | POA: Insufficient documentation

## 2017-02-21 DIAGNOSIS — G894 Chronic pain syndrome: Secondary | ICD-10-CM | POA: Insufficient documentation

## 2017-02-21 DIAGNOSIS — M47816 Spondylosis without myelopathy or radiculopathy, lumbar region: Secondary | ICD-10-CM | POA: Insufficient documentation

## 2017-02-21 DIAGNOSIS — I700 Atherosclerosis of aorta: Secondary | ICD-10-CM | POA: Diagnosis not present

## 2017-02-21 DIAGNOSIS — Z79899 Other long term (current) drug therapy: Secondary | ICD-10-CM | POA: Diagnosis not present

## 2017-02-21 DIAGNOSIS — Z96653 Presence of artificial knee joint, bilateral: Secondary | ICD-10-CM | POA: Insufficient documentation

## 2017-02-21 DIAGNOSIS — M25561 Pain in right knee: Secondary | ICD-10-CM | POA: Diagnosis present

## 2017-02-21 DIAGNOSIS — M419 Scoliosis, unspecified: Secondary | ICD-10-CM | POA: Insufficient documentation

## 2017-02-21 DIAGNOSIS — M170 Bilateral primary osteoarthritis of knee: Secondary | ICD-10-CM | POA: Diagnosis not present

## 2017-02-21 DIAGNOSIS — F411 Generalized anxiety disorder: Secondary | ICD-10-CM | POA: Insufficient documentation

## 2017-02-21 DIAGNOSIS — M25562 Pain in left knee: Secondary | ICD-10-CM | POA: Diagnosis present

## 2017-02-21 DIAGNOSIS — E785 Hyperlipidemia, unspecified: Secondary | ICD-10-CM | POA: Diagnosis not present

## 2017-02-21 MED ORDER — HYDROCODONE-ACETAMINOPHEN 7.5-325 MG PO TABS: 1.0000 | ORAL_TABLET | Freq: Three times a day (TID) | ORAL | 0 refills | Status: DC | PRN

## 2017-02-21 MED ORDER — BACLOFEN 10 MG PO TABS: 10.0000 mg | ORAL_TABLET | Freq: Three times a day (TID) | ORAL | 6 refills | Status: DC | PRN

## 2017-02-21 MED ORDER — GABAPENTIN 300 MG PO CAPS: 300.0000 mg | ORAL_CAPSULE | Freq: Two times a day (BID) | ORAL | 6 refills | Status: DC

## 2017-02-21 NOTE — Progress Notes (Signed)
Nursing Pain Medication Assessment:  Safety precautions to be maintained throughout the outpatient stay will include: orient to surroundings, keep bed in low position, maintain call bell within reach at all times, provide assistance with transfer out of bed and ambulation.  Medication Inspection Compliance: Cindy Rose did not comply with our request to bring her pills to be counted. She was reminded that bringing the medication bottles, even when empty, is a requirement.  Medication: None brought in. Pill/Patch Count: None available to be counted. Bottle Appearance: No container available. Did not bring bottle(s) to appointment. Filled Date: N/A Last Medication intake:  Yesterday

## 2017-02-21 NOTE — Patient Instructions (Addendum)
  You have been given 2 scripts for Norco today.       1. Sign opioid agreement 2. Rx for Hydrocodone 7.5 mg up to three times a day as needed 3. Continue Gabapentin and Baclofen as prescribed, Rx sent for 6 months 4. Follow up in 2 months

## 2017-02-21 NOTE — Progress Notes (Signed)
Patient's Name: Cindy Rose  MRN: [id number]  Referring Provider: No ref. provider found  DOB: March 05, 1946  PCP: Patient, No Pcp Per  DOS: 02/21/2017  Note by: Gillis Santa, MD  Service setting: Ambulatory outpatient  Specialty: Interventional Pain Management  Location: [facility] (AMB) Pain Management Facility    Patient type: Established   Primary Reason(s) for Visit: Encounter for evaluation before starting new chronic pain management plan of care (Level of risk: moderate) CC: Knee Pain (bilateral)  HPI  Cindy Rose is a 71 y.o. year old, female patient, who comes today for a follow-up evaluation to review the test results and decide on a treatment plan. She has Primary osteoarthritis of both knees; Chronic pain syndrome; History of bilateral knee replacement; Anxiety state; Myofascial pain; Lumbar spondylosis; Arthritis; Bilateral hearing loss; Essential hypertension; GERD (gastroesophageal reflux disease); Hypercholesterolemia; Osteopenia; and Spondylosis of lumbar region without myelopathy or radiculopathy on their problem list. Her primarily concern today is the Knee Pain (bilateral)  Pain Assessment: Location: Right, Left Knee Radiating:   Onset: More than a month ago Duration: Chronic pain Quality: Aching, Stabbing Severity: 7 /10 (self-reported pain score)  Note: Reported level is inconsistent with clinical observations. Clinically the patient looks like a 3/10 A 4/10 is viewed as "Moderately Severe" and described as impossible to ignore for more than a few minutes. With effort, patients may still be able to manage work or participate in some social activities. Very difficult to concentrate. Signs of autonomic nervous system discharge are evident: dilated pupils (mydriasis); mild sweating (diaphoresis); sleep interference. Heart rate becomes elevated (>115 bpm). Diastolic blood pressure (lower number) rises above 100 mmHg. Patients find relief in laying down and not moving.       When using  our objective Pain Scale, levels between 6 and 10/10 are said to belong in an emergency room, as it progressively worsens from a 6/10, described as severely limiting, requiring emergency care not usually available at an outpatient pain management facility. At a 6/10 level, communication becomes difficult and requires great effort. Assistance to reach the emergency department may be required. Facial flushing and profuse sweating along with potentially dangerous increases in heart rate and blood pressure will be evident. Effect on ADL:   Timing: Intermittent Modifying factors: rest, heat  Cindy Rose comes in today for a follow-up visit after her initial evaluation on 01/17/2017. Today we went over the results of her tests. These were explained in "Layman's terms". During today's appointment we went over my diagnostic impression, as well as the proposed treatment plan.  In considering the treatment plan options, Cindy Rose was reminded that I no longer take patients for medication management only. I asked her to let me know if she had no intention of taking advantage of the interventional therapies, so that we could make arrangements to provide this space to someone interested. I also made it clear that undergoing interventional therapies for the purpose of getting pain medications is very inappropriate on the part of a patient, and it will not be tolerated in this practice. This type of behavior would suggest true addiction and therefore it requires referral to an addiction specialist.   Further details on both, my assessment(s), as well as the proposed treatment plan, please see below.  Controlled Substance Pharmacotherapy Assessment REMS (Risk Evaluation and Mitigation Strategy)  Analgesic: Hydrocodone 7.5 mg 3 times daily as needed, quantity 90 MME/day: Approximately 20-25 mg/day. Pill Count: None expected due to no prior prescriptions written by our practice. Louann Liv  Jerilynn Mages, RN  02/21/2017 11:01  AM  Signed Nursing Pain Medication Assessment:  Safety precautions to be maintained throughout the outpatient stay will include: orient to surroundings, keep bed in low position, maintain call bell within reach at all times, provide assistance with transfer out of bed and ambulation.  Medication Inspection Compliance: Cindy Rose did not comply with our request to bring her pills to be counted. She was reminded that bringing the medication bottles, even when empty, is a requirement.  Medication: None brought in. Pill/Patch Count: None available to be counted. Bottle Appearance: No container available. Did not bring bottle(s) to appointment. Filled Date: N/A Last Medication intake:  Yesterday   Pharmacokinetics: Liberation and absorption (onset of action): WNL Distribution (time to peak effect): WNL Metabolism and excretion (duration of action): WNL         Pharmacodynamics: Desired effects: Analgesia: Cindy Rose reports >50% benefit. Functional ability: Patient reports that medication allows her to accomplish basic ADLs Clinically meaningful improvement in function (CMIF): Sustained CMIF goals met Perceived effectiveness: Described as relatively effective, allowing for increase in activities of daily living (ADL) Undesirable effects: Side-effects or Adverse reactions: None reported Monitoring: [place] PMP: Online review of the past 42-monthperiod previously conducted. Not applicable at this point since we have not taken over the patient's medication management yet. List of all Serum Drug Screening Test(s):  No results found for: AMPHSCRSER, BARBSCRSER, BENZOSCRSER, COCAINSCRSER, PCPSCRSER, THCSCRSER, OPIATESCRSER, OLos [place]ill[place] PFriersonList of all UDS test(s) done:  Lab Results  Component Value Date   SUMMARY FINAL 12/06/2016   Last UDS on record: Summary  Date Value Ref Range Status  12/06/2016 FINAL  Final    Comment:     ==================================================================== TOXASSURE COMP DRUG ANALYSIS,UR ==================================================================== Test                             Result       Flag       Units Drug Present and Declared for Prescription Verification   Norhydrocodone                 625          EXPECTED   ng/mg creat    Norhydrocodone is an expected metabolite of hydrocodone.   Gabapentin                     PRESENT      EXPECTED   Acetaminophen                  PRESENT      EXPECTED Drug Present not Declared for Prescription Verification   Baclofen                       PRESENT      UNEXPECTED Drug Absent but Declared for Prescription Verification   Hydrocodone                    Not Detected UNEXPECTED ng/mg creat    Hydrocodone is almost always present in patients taking this drug    consistently. Absence of hydrocodone could be due to lapse of    time since the last dose or unusual pharmacokinetics (rapid    metabolism).   Carisoprodol                   Not Detected UNEXPECTED   Naproxen  Not Detected UNEXPECTED ==================================================================== Test                      Result    Flag   Units      Ref Range   Creatinine              32               mg/dL      >=20 ==================================================================== Declared Medications:  The flagging and interpretation on this report are based on the  following declared medications.  Unexpected results may arise from  inaccuracies in the declared medications.  **Note: The testing scope of this panel includes these medications:  Carisoprodol  Gabapentin  Hydrocodone (Hydrocodone-Acetaminophen)  Naproxen  **Note: The testing scope of this panel does not include small to  moderate amounts of these reported medications:  Acetaminophen (Hydrocodone-Acetaminophen)  **Note: The testing scope of this panel does not include  following  reported medications:  Calcium (Calcium/Magnesium)  Celecoxib  Hydrochlorothiazide (Valsartan-Hydrochlorothizide)  Magnesium (Calcium/Magnesium)  Omeprazole  Valsartan (Valsartan-Hydrochlorothizide)  Zinc ==================================================================== For clinical consultation, please call [phone]. ====================================================================    UDS interpretation: No unexpected findings.          Medication Assessment Form: Patient introduced to form today Treatment compliance: Treatment may start today if patient agrees with proposed plan. Evaluation of compliance is not applicable at this point Risk Assessment Profile: Aberrant behavior: See initial evaluations. None observed or detected today Comorbid factors increasing risk of overdose: See initial evaluation. No additional risks detected today Medical Psychology Evaluation: Please see scanned results in medical record. Opioid Risk Tool - 02/21/17 1100      Family History of Substance Abuse   Alcohol  Positive Female    Illegal Drugs  Positive Female    Rx Drugs  Negative      Personal History of Substance Abuse   Alcohol  Negative    Illegal Drugs  Negative    Rx Drugs  Negative      Age   Age between 50-45 years   No      History of Preadolescent Sexual Abuse   History of Preadolescent Sexual Abuse  Negative or Female      Psychological Disease   Psychological Disease  Negative    Depression  Negative      Total Score   Opioid Risk Tool Scoring  6    Opioid Risk Interpretation  Moderate Risk      ORT Scoring interpretation table:  Score <3 = Low Risk for SUD  Score between 4-7 = Moderate Risk for SUD  Score >8 = High Risk for Opioid Abuse   Risk Mitigation Strategies:  Patient opioid safety counseling: Completed today. Counseling provided to patient as per "Patient Counseling Document". Document signed by patient, attesting to counseling and  understanding Patient-Prescriber Agreement (PPA): Obtained today.  Controlled substance notification to other providers: Written and sent today.  Pharmacologic Plan: Today we may be taking over the patient's pharmacological regimen. See below             Laboratory Chemistry  Inflammation Markers (CRP: Acute Phase) (ESR: Chronic Phase) No results found for: CRP, ESRSEDRATE               Renal Function Markers No results found for: BUN, CREATININE, GFRAA, GFRNONAA               Hepatic Function Markers No results found for: AST, ALT, ALBUMIN, ALKPHOS,  HCVAB               Electrolytes No results found for: NA, K, CL, CALCIUM, MG               Neuropathy Markers No results found for: VITAMINB12               Bone Pathology Markers No results found for: Hendricks Milo, VD125OH2TOT, [id number], ML4650PT4, 25OHVITD1, 25OHVITD2, 25OHVITD3, CALCIUM, TESTOFREE, TESTOSTERONE               Coagulation Parameters No results found for: INR, LABPROT, APTT, PLT               Cardiovascular Markers No results found for: BNP, HGB, HCT               Note: Lab results reviewed.  Recent Diagnostic Imaging Review   Cervical DG complete:  Results for orders placed during the hospital encounter of 12/06/16  DG Cervical Spine Complete   Narrative CLINICAL DATA:  Cervicalgia  EXAM: CERVICAL SPINE - COMPLETE 4+ VIEW  COMPARISON:  None.  FINDINGS: Frontal, lateral, open-mouth odontoid, and bilateral oblique views were obtained. There is no fracture or spondylolisthesis. Prevertebral soft tissues and predental space regions are normal. There is marked disc space narrowing at C5-6 and C6-7. There is mild disc space narrowing at C7-T1. There are prominent anterior osteophytes at C2, C5, and C6. There is facet osteoarthritic change with exit foraminal narrowing at C3-4 bilaterally, at C4-5 on the left, at C5-6 bilaterally, and at lung apices are clear.  C6-7 bilaterally.  IMPRESSION: Multilevel osteoarthritic change.  No fracture or spondylolisthesis.   Electronically Signed   By: Lowella Grip III M.D.   On: 12/07/2016 08:33     Thoracic DG 2-3 views:  Results for orders placed during the hospital encounter of 12/06/16  DG Thoracic Spine 2 View   Narrative CLINICAL DATA:  Dorsalgia  EXAM: THORACIC SPINE 3 VIEWS  COMPARISON:  None.  FINDINGS: Frontal, lateral, and swimmer's views were obtained. There is no fracture or spondylolisthesis. There is disc space narrowing at several levels. No erosive change or paraspinous lesion. There is aortic atherosclerosis.  IMPRESSION: Relatively mild osteoarthritic change. No fracture or spondylolisthesis. There is aortic atherosclerosis.  Aortic Atherosclerosis (ICD10-I70.0).   Electronically Signed   By: Lowella Grip III M.D.   On: 12/07/2016 08:34     Lumbar DG Bending views:  Results for orders placed during the hospital encounter of 12/06/16  DG Lumbar Spine Complete W/Bend   Narrative CLINICAL DATA:  Lumbago  EXAM: LUMBAR SPINE - COMPLETE WITH BENDING VIEWS  COMPARISON:  None.  FINDINGS: Upright frontal, upright neutral lateral, upright flexion lateral, upright extension lateral, spot lumbosacral lateral, and bilateral oblique views were obtained -total 7 views. There are 5 non-rib-bearing lumbar type vertebral bodies. There is upper lumbar levoscoliosis with rotatory component. There is no fracture. There is no spondylolisthesis on neutral lateral view. There is no appreciable change in lateral alignment with flexion and extension. There is moderate disc space narrowing at L2-3, L3-4, L4-5, and L5-S1. There is facet osteoarthritic change at L4-5 and L5-S1 bilaterally.  IMPRESSION: Scoliosis. Areas of osteoarthritic change at several levels. No spondylolisthesis or fracture. No change in lateral alignment with flexion-extension.   Electronically  Signed   By: Lowella Grip III M.D.   On: 12/07/2016 08:36     Complexity Note: Imaging results reviewed. Results shared with Cindy Rose, using Layman's terms.  Meds   Current Outpatient Medications:  .  atorvastatin (LIPITOR) 40 MG tablet, Take 40 mg by mouth., Disp: , Rfl:  .  baclofen (LIORESAL) 10 MG tablet, Take 1 tablet (10 mg total) 3 (three) times daily as needed by mouth for muscle spasms., Disp: 90 tablet, Rfl: 6 .  CALCIUM-MAGNESIUM-ZINC PO, Take by mouth., Disp: , Rfl:  .  celecoxib (CELEBREX) 200 MG capsule, TAKE ONE CAPSULE BY MOUTH ONCE DAILY, Disp: , Rfl:  .  gabapentin (NEURONTIN) 300 MG capsule, Take 1 capsule (300 mg total) 2 (two) times daily by mouth., Disp: 60 capsule, Rfl: 6 .  HYDROcodone-acetaminophen (NORCO) 7.5-325 MG tablet, Take 1 tablet 3 (three) times daily as needed by mouth for moderate pain., Disp: 90 tablet, Rfl: 0 .  naproxen (NAPROSYN) 500 MG tablet, Take by mouth., Disp: , Rfl:  .  omeprazole (PRILOSEC) 40 MG capsule, TAKE ONE CAPSULE BY MOUTH ONCE DAILY, Disp: , Rfl:  .  valsartan-hydrochlorothiazide (DIOVAN-HCT) 160-12.5 MG tablet, TAKE ONE TABLET BY MOUTH IN THE EVENING, Disp: , Rfl:  .  omeprazole (PRILOSEC) 40 MG capsule, Take 40 mg by mouth., Disp: , Rfl:  .  valsartan-hydrochlorothiazide (DIOVAN-HCT) 160-12.5 MG tablet, Take by mouth., Disp: , Rfl:   ROS  Constitutional: Denies any fever or chills Gastrointestinal: No reported hemesis, hematochezia, vomiting, or acute GI distress Musculoskeletal: Denies any acute onset joint swelling, redness, loss of ROM, or weakness Neurological: No reported episodes of acute onset apraxia, aphasia, dysarthria, agnosia, amnesia, paralysis, loss of coordination, or loss of consciousness  Allergies  Cindy Rose is allergic to tape.  PFSH  Drug: Cindy Rose  reports that she does not use drugs. Alcohol:  reports that she does not drink alcohol. Tobacco:  reports that she  quit smoking about 10 years ago. Her smoking use included cigarettes. she has never used smokeless tobacco. Medical:  has a past medical history of Allergy, Anxiety, Arthritis, Hyperlipidemia, and Hypertension. Surgical: Cindy Rose  has a past surgical history that includes Joint replacement; Abdominal hysterectomy; and Wrist surgery. Family: family history includes Cancer in her father; Heart disease in her mother.  Constitutional Exam  General appearance: Well nourished, well developed, and well hydrated. In no apparent acute distress Vitals:   02/21/17 1054 02/21/17 1056  BP:  131/60  Pulse: 64   Resp: 18   Temp: 98.3 F (36.8 C)   SpO2: 99%   Weight: 220 lb (99.8 kg)   Height: _0  (1.626 m)    BMI Assessment: Estimated body mass index is 37.76 kg/m as calculated from the following:   Height as of this encounter: _1  (1.626 m).   Weight as of this encounter: 220 lb (99.8 kg).  BMI interpretation table: BMI level Category Range association with higher incidence of chronic pain  <18 kg/m2 Underweight   18.5-24.9 kg/m2 Ideal body weight   25-29.9 kg/m2 Overweight Increased incidence by 20%  30-34.9 kg/m2 Obese (Class I) Increased incidence by 68%  35-39.9 kg/m2 Severe obesity (Class II) Increased incidence by 136%  >40 kg/m2 Extreme obesity (Class III) Increased incidence by 254%   BMI Readings from Last 4 Encounters:  02/21/17 37.76 kg/m  01/17/17 37.76 kg/m  01/04/17 37.76 kg/m  12/06/16 37.76 kg/m   Wt Readings from Last 4 Encounters:  02/21/17 220 lb (99.8 kg)  01/17/17 220 lb (99.8 kg)  01/04/17 220 lb (99.8 kg)  12/06/16 220 lb (99.8 kg)  Psych/Mental status: Alert, oriented x 3 (person, place, & time)  Eyes: PERLA Respiratory: No evidence of acute respiratory distress  Cervical Spine Area Exam  Skin & Axial Inspection: No masses, redness, edema, swelling, or associated skin lesions Alignment: Symmetrical Functional ROM: Unrestricted ROM       Stability: No instability detected Muscle Tone/Strength: Functionally intact. No obvious neuro-muscular anomalies detected. Sensory (Neurological): Unimpaired Palpation: No palpable anomalies              Upper Extremity (UE) Exam    Side: Right upper extremity  Side: Left upper extremity  Skin & Extremity Inspection: Skin color, temperature, and hair growth are WNL. No peripheral edema or cyanosis. No masses, redness, swelling, asymmetry, or associated skin lesions. No contractures.  Skin & Extremity Inspection: Skin color, temperature, and hair growth are WNL. No peripheral edema or cyanosis. No masses, redness, swelling, asymmetry, or associated skin lesions. No contractures.  Functional ROM: Unrestricted ROM          Functional ROM: Unrestricted ROM          Muscle Tone/Strength: Functionally intact. No obvious neuro-muscular anomalies detected.  Muscle Tone/Strength: Functionally intact. No obvious neuro-muscular anomalies detected.  Sensory (Neurological): Unimpaired          Sensory (Neurological): Unimpaired          Palpation: No palpable anomalies              Palpation: No palpable anomalies              Specialized Test(s): Deferred         Specialized Test(s): Deferred          Thoracic Spine Area Exam  Skin & Axial Inspection: No masses, redness, or swelling Alignment: Symmetrical Functional ROM: Unrestricted ROM Stability: No instability detected Muscle Tone/Strength: Functionally intact. No obvious neuro-muscular anomalies detected. Sensory (Neurological): Unimpaired Muscle strength & Tone: No palpable anomalies  Lumbar Spine Area Exam  Skin & Axial Inspection: No masses, redness, or swelling Alignment: Symmetrical Functional ROM: Unrestricted ROM      Stability: No instability detected Muscle Tone/Strength: Functionally intact. No obvious neuro-muscular anomalies detected. Sensory (Neurological): Unimpaired Palpation: No palpable anomalies       Provocative  Tests: Lumbar Hyperextension and rotation test: evaluation deferred today       Lumbar Lateral bending test: evaluation deferred today       Patrick's Maneuver: evaluation deferred today                    Gait & Posture Assessment  Ambulation: Unassisted Gait: Relatively normal for age and body habitus Posture: WNL   Lower Extremity Exam    Side: Right lower extremity  Side: Left lower extremity  Skin & Extremity Inspection: Skin color, temperature, and hair growth are WNL. No peripheral edema or cyanosis. No masses, redness, swelling, asymmetry, or associated skin lesions. No contractures.  Skin & Extremity Inspection: Skin color, temperature, and hair growth are WNL. No peripheral edema or cyanosis. No masses, redness, swelling, asymmetry, or associated skin lesions. No contractures.  Functional ROM: Unrestricted ROM          Functional ROM: Unrestricted ROM          Muscle Tone/Strength: Functionally intact. No obvious neuro-muscular anomalies detected.  Muscle Tone/Strength: Functionally intact. No obvious neuro-muscular anomalies detected.  Sensory (Neurological): Unimpaired  Sensory (Neurological): Unimpaired  Palpation: No palpable anomalies  Palpation: No palpable anomalies   Assessment & Plan  Primary Diagnosis & Pertinent Problem List: The primary encounter diagnosis was Primary osteoarthritis of  both knees. Diagnoses of Chronic pain syndrome, History of bilateral knee replacement, Anxiety state, Myofascial pain, and Lumbar spondylosis were also pertinent to this visit.  Visit Diagnosis: 1. Primary osteoarthritis of both knees   2. Chronic pain syndrome   3. History of bilateral knee replacement   4. Anxiety state   5. Myofascial pain   6. Lumbar spondylosis    71 y.o. old female with a past medical history significant for bilateral knee replacements who presents with bilateral knee pain. Patient states that she has had bilateral knee replacements done in [place] many years  ago, left one was done in 2008 and right one was done in 2007. She states that these knee replacements were done "incorrectly". She states that her knees are misaligned as a result of the surgery.  Patient is status post bilateral genicular nerve block for bilateral knee pain on 01/04/17. She states that the block was effective for approximately 2 weeks.  She returns today after seeing a pain psychologist and is noted to be low risk for substance abuse potential.  Patient will complete opioid agreement with our clinic today and we will start her previous opioid regimen of hydrocodone 7.5 mg 3 times daily as needed.  Continue baclofen and gabapentin as prescribed.  Plan: -Complete opioid agreement -Hydrocodone 7.5 mg 3 times daily as needed,  quantity 90 a month, prescription for 2 months -Continue baclofen and gabapentin as previously prescribed -Follow-up in 2 months.   Plan of Care  Pharmacotherapy (Medications Ordered): Meds ordered this encounter  Medications  . baclofen (LIORESAL) 10 MG tablet    Sig: Take 1 tablet (10 mg total) 3 (three) times daily as needed by mouth for muscle spasms.    Dispense:  90 tablet    Refill:  6    Do not place this medication, or any other prescription from our practice, on "Automatic Refill". Patient may have prescription filled one day early if pharmacy is closed on scheduled refill date.  Marland Kitchen DISCONTD: HYDROcodone-acetaminophen (NORCO) 7.5-325 MG tablet    Sig: Take 1 tablet 3 (three) times daily as needed by mouth for moderate pain.    Dispense:  90 tablet    Refill:  0    For chronic pain To fill on or after: 02/21/2017, 03/22/2017  . gabapentin (NEURONTIN) 300 MG capsule    Sig: Take 1 capsule (300 mg total) 2 (two) times daily by mouth.    Dispense:  60 capsule    Refill:  6  . HYDROcodone-acetaminophen (NORCO) 7.5-325 MG tablet    Sig: Take 1 tablet 3 (three) times daily as needed by mouth for moderate pain.    Dispense:  90 tablet    Refill:   0    For chronic pain To fill on or after: 02/21/2017, 03/22/2017     Considering:   -Bilateral genicular nerves block (first one 9/19 with >80% pain relief for approx 10-14 days), consider repeating with steroid or RFA -Intra-articular knee steroid injection -Lumbar medial branch blocks, L3-L5 bilaterally -bilateral cervical medial branch nerve blocks -Shoulder joint injection -SI joint injection     Provider-requested follow-up: Return in about 2 months (around 04/23/2017) for Medication Management.  Future Appointments  Date Time Provider Omer  04/13/2017  1:30 PM Gillis Santa, MD [facility] None    Primary Care Physician: Patient, No Pcp Per Location: [facility] Outpatient Pain Management Facility Note by: Gillis Santa, M.D Date: 02/21/2017; Time: 3:04 PM  Patient Instructions   You have been given  2 scripts for Norco today.       1. Sign opioid agreement 2. Rx for Hydrocodone 7.5 mg up to three times a day as needed 3. Continue Gabapentin and Baclofen as prescribed, Rx sent for 6 months 4. Follow up in 2 months

## 2017-04-13 ENCOUNTER — Ambulatory Visit: Payer: Medicare HMO | Admitting: Student in an Organized Health Care Education/Training Program

## 2017-04-25 ENCOUNTER — Encounter: Payer: Medicare HMO | Admitting: Student in an Organized Health Care Education/Training Program

## 2017-05-16 ENCOUNTER — Encounter: Payer: Self-pay | Admitting: Student in an Organized Health Care Education/Training Program

## 2017-05-16 ENCOUNTER — Other Ambulatory Visit: Payer: Self-pay

## 2017-05-16 ENCOUNTER — Ambulatory Visit
Payer: Medicare HMO | Attending: Student in an Organized Health Care Education/Training Program | Admitting: Student in an Organized Health Care Education/Training Program

## 2017-05-16 VITALS — BP 138/56 | HR 71 | Temp 98.2°F | Resp 16 | Ht 65.0 in | Wt 220.0 lb

## 2017-05-16 DIAGNOSIS — Z96653 Presence of artificial knee joint, bilateral: Secondary | ICD-10-CM | POA: Insufficient documentation

## 2017-05-16 DIAGNOSIS — H9193 Unspecified hearing loss, bilateral: Secondary | ICD-10-CM | POA: Diagnosis not present

## 2017-05-16 DIAGNOSIS — E785 Hyperlipidemia, unspecified: Secondary | ICD-10-CM | POA: Diagnosis not present

## 2017-05-16 DIAGNOSIS — M170 Bilateral primary osteoarthritis of knee: Secondary | ICD-10-CM | POA: Insufficient documentation

## 2017-05-16 DIAGNOSIS — Z8249 Family history of ischemic heart disease and other diseases of the circulatory system: Secondary | ICD-10-CM | POA: Diagnosis not present

## 2017-05-16 DIAGNOSIS — M25561 Pain in right knee: Secondary | ICD-10-CM | POA: Diagnosis present

## 2017-05-16 DIAGNOSIS — Z79899 Other long term (current) drug therapy: Secondary | ICD-10-CM | POA: Insufficient documentation

## 2017-05-16 DIAGNOSIS — K219 Gastro-esophageal reflux disease without esophagitis: Secondary | ICD-10-CM | POA: Diagnosis not present

## 2017-05-16 DIAGNOSIS — I10 Essential (primary) hypertension: Secondary | ICD-10-CM | POA: Diagnosis not present

## 2017-05-16 DIAGNOSIS — E7800 Pure hypercholesterolemia, unspecified: Secondary | ICD-10-CM | POA: Insufficient documentation

## 2017-05-16 DIAGNOSIS — Z90710 Acquired absence of both cervix and uterus: Secondary | ICD-10-CM | POA: Diagnosis not present

## 2017-05-16 DIAGNOSIS — M8588 Other specified disorders of bone density and structure, other site: Secondary | ICD-10-CM | POA: Diagnosis not present

## 2017-05-16 DIAGNOSIS — M47816 Spondylosis without myelopathy or radiculopathy, lumbar region: Secondary | ICD-10-CM | POA: Diagnosis not present

## 2017-05-16 DIAGNOSIS — Z87891 Personal history of nicotine dependence: Secondary | ICD-10-CM | POA: Diagnosis not present

## 2017-05-16 DIAGNOSIS — F411 Generalized anxiety disorder: Secondary | ICD-10-CM | POA: Diagnosis not present

## 2017-05-16 DIAGNOSIS — G894 Chronic pain syndrome: Secondary | ICD-10-CM | POA: Insufficient documentation

## 2017-05-16 DIAGNOSIS — M7918 Myalgia, other site: Secondary | ICD-10-CM

## 2017-05-16 MED ORDER — HYDROCODONE-ACETAMINOPHEN 7.5-325 MG PO TABS: 1.0000 | ORAL_TABLET | Freq: Three times a day (TID) | ORAL | 0 refills | Status: DC | PRN

## 2017-05-16 NOTE — Progress Notes (Signed)
Patient's Name: Cindy Rose  MRN: [id number]  Referring Provider: No ref. provider found  DOB: Aug 16, 1945  PCP: Patient, No Pcp Per  DOS: 05/16/2017  Note by: Gillis Santa, MD  Service setting: Ambulatory outpatient  Specialty: Interventional Pain Management  Location: [facility] (AMB) Pain Management Facility    Patient type: Established   Primary Reason(s) for Visit: Encounter for prescription drug management. (Level of risk: moderate)  CC: Knee Pain (bilateral) and Back Pain (lower)  HPI  [name] is a 72 y.o. year old, female patient, who comes today for a medication management evaluation. She has Primary osteoarthritis of both knees; Chronic pain syndrome; History of bilateral knee replacement; Anxiety state; Myofascial pain; Lumbar spondylosis; Arthritis; Bilateral hearing loss; Essential hypertension; GERD (gastroesophageal reflux disease); Hypercholesterolemia; Osteopenia; and Spondylosis of lumbar region without myelopathy or radiculopathy on their problem list. Her primarily concern today is the Knee Pain (bilateral) and Back Pain (lower)  Pain Assessment: Location: Left, Right Knee Radiating: down lower legs to top of feet effects right big toe Onset: More than a month ago Duration: Chronic pain Quality: Aching, Constant, Burning, Radiating, Sore, Pressure Severity: 5 /10 (self-reported pain score)  Note: Reported level is inconsistent with clinical observations. Clinically the patient looks like a 3/10             When using our objective Pain Scale, levels between 6 and 10/10 are said to belong in an emergency room, as it progressively worsens from a 6/10, described as severely limiting, requiring emergency care not usually available at an outpatient pain management facility. At a 6/10 level, communication becomes difficult and requires great effort. Assistance to reach the emergency department may be required. Facial flushing and profuse sweating along with potentially dangerous  increases in heart rate and blood pressure will be evident. Effect on ADL: prolonged standing., prolonged walking, keffects where she goes Timing: Constant Modifying factors: rest, medication  [name] was last scheduled for an appointment on 02/21/2017 for medication management. During today's appointment we reviewed [name]'s chronic pain status, as well as her outpatient medication regimen.  Patient returns for follow-up.  Overall doing well.  She is established with a primary care physician.  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  The patient  reports that she does not use drugs. Her body mass index is 36.61 kg/m.  Further details on both, my assessment(s), as well as the proposed treatment plan, please see below.  Controlled Substance Pharmacotherapy Assessment REMS (Risk Evaluation and Mitigation Strategy)  Analgesic: Hydrocodone 7.5 mg 3 times daily as needed, quantity 90 MME/day: Approximately 20-25 mg/day. [name], RN  05/16/2017  1:29 PM  Sign at close encounter Nursing Pain Medication Assessment:  Safety precautions to be maintained throughout the outpatient stay will include: orient to surroundings, keep bed in low position, maintain call bell within reach at all times, provide assistance with transfer out of bed and ambulation.  Medication Inspection Compliance: Pill count conducted under aseptic conditions, in front of the patient. Neither the pills nor the bottle was removed from the patient's sight at any time. Once count was completed pills were immediately returned to the patient in their original bottle.  Medication: See above Pill/Patch Count: 9 of 90 pills remain Pill/Patch Appearance: Markings consistent with prescribed medication Bottle Appearance: Standard pharmacy container. Clearly labeled. Filled Date: 70 / 66 / 2018 Last Medication intake:  Today   Pharmacokinetics: Liberation and absorption (onset of action): WNL Distribution  (time to peak  effect): WNL Metabolism and excretion (duration of action): WNL         Pharmacodynamics: Desired effects: Analgesia: [name] reports >50% benefit. Functional ability: Patient reports that medication allows her to accomplish basic ADLs Clinically meaningful improvement in function (CMIF): Sustained CMIF goals met Perceived effectiveness: Described as relatively effective, allowing for increase in activities of daily living (ADL) Undesirable effects: Side-effects or Adverse reactions: None reported Monitoring: [place] PMP: Online review of the past 59-monthperiod conducted. Compliant with practice rules and regulations Last UDS on record: Summary  Date Value Ref Range Status  12/06/2016 FINAL  Final    Comment:    ==================================================================== [facility] COMP DRUG ANALYSIS,UR ==================================================================== Test                             Result       Flag       Units Drug Present and Declared for Prescription Verification   Norhydrocodone                 625          EXPECTED   ng/mg creat    Norhydrocodone is an expected metabolite of hydrocodone.   Gabapentin                     PRESENT      EXPECTED   Acetaminophen                  PRESENT      EXPECTED Drug Present not Declared for Prescription Verification   Baclofen                       PRESENT      UNEXPECTED Drug Absent but Declared for Prescription Verification   Hydrocodone                    Not Detected UNEXPECTED ng/mg creat    Hydrocodone is almost always present in patients taking this drug    consistently. Absence of hydrocodone could be due to lapse of    time since the last dose or unusual pharmacokinetics (rapid    metabolism).   Carisoprodol                   Not Detected UNEXPECTED   Naproxen                       Not Detected UNEXPECTED ==================================================================== Test                       Result    Flag   Units      Ref Range   Creatinine              32               mg/dL      >=20 ==================================================================== Declared Medications:  The flagging and interpretation on this report are based on the  following declared medications.  Unexpected results may arise from  inaccuracies in the declared medications.  **Note: The testing scope of this panel includes these medications:  Carisoprodol  Gabapentin  Hydrocodone (Hydrocodone-Acetaminophen)  Naproxen  **Note: The testing scope of this panel does not include small to  moderate amounts of these reported medications:  Acetaminophen (Hydrocodone-Acetaminophen)  **Note: The testing scope of this panel does not include following  reported medications:  Calcium (Calcium/Magnesium)  Celecoxib  Hydrochlorothiazide (Valsartan-Hydrochlorothizide)  Magnesium (Calcium/Magnesium)  Omeprazole  Valsartan (Valsartan-Hydrochlorothizide)  Zinc ==================================================================== For clinical consultation, please call [phone]. ====================================================================    UDS interpretation: Compliant          Medication Assessment Form: Reviewed. Patient indicates being compliant with therapy Treatment compliance: Compliant Risk Assessment Profile: Aberrant behavior: See prior evaluations. None observed or detected today Comorbid factors increasing risk of overdose: See prior notes. No additional risks detected today Risk of substance use disorder (SUD): Low Opioid Risk Tool - 05/16/17 1327      Family History of Substance Abuse   Alcohol  Negative    Illegal Drugs  Negative    Rx Drugs  Negative      Personal History of Substance Abuse   Alcohol  Negative    Illegal Drugs  Negative    Rx Drugs  Negative      Age   Age between 18-45 years   No      Psychological Disease   Psychological Disease   Negative    Depression  Negative      Total Score   Opioid Risk Tool Scoring  0    Opioid Risk Interpretation  Low Risk      ORT Scoring interpretation table:  Score <3 = Low Risk for SUD  Score between 4-7 = Moderate Risk for SUD  Score >8 = High Risk for Opioid Abuse   Risk Mitigation Strategies:  Patient Counseling: Covered Patient-Prescriber Agreement (PPA): Present and active  Notification to other healthcare providers: Done  Pharmacologic Plan: No change in therapy, at this time.             Recent Diagnostic Imaging Results  DG C-Arm 1-60 Min-No Report Fluoroscopy was utilized by the requesting physician.  No radiographic  interpretation.   Complexity Note: Imaging results reviewed. Results shared with Ms. Norlander, using Layman's terms.                         Meds   Current Outpatient Medications:  .  atorvastatin (LIPITOR) 40 MG tablet, Take 40 mg by mouth., Disp: , Rfl:  .  baclofen (LIORESAL) 10 MG tablet, Take 1 tablet (10 mg total) 3 (three) times daily as needed by mouth for muscle spasms., Disp: 90 tablet, Rfl: 6 .  CALCIUM-MAGNESIUM-ZINC PO, Take by mouth., Disp: , Rfl:  .  celecoxib (CELEBREX) 200 MG capsule, TAKE ONE CAPSULE BY MOUTH ONCE DAILY, Disp: , Rfl:  .  gabapentin (NEURONTIN) 300 MG capsule, Take 1 capsule (300 mg total) 2 (two) times daily by mouth., Disp: 60 capsule, Rfl: 6 .  HYDROcodone-acetaminophen (NORCO) 7.5-325 MG tablet, Take 1 tablet by mouth 3 (three) times daily as needed for moderate pain., Disp: 90 tablet, Rfl: 0 .  losartan (COZAAR) 50 MG tablet, Take by mouth., Disp: , Rfl:  .  naproxen (NAPROSYN) 500 MG tablet, Take by mouth., Disp: , Rfl:  .  omeprazole (PRILOSEC) 40 MG capsule, Take 40 mg by mouth., Disp: , Rfl:  .  omeprazole (PRILOSEC) 40 MG capsule, TAKE ONE CAPSULE BY MOUTH ONCE DAILY, Disp: , Rfl:  .  valsartan-hydrochlorothiazide (DIOVAN-HCT) 160-12.5 MG tablet, TAKE ONE TABLET BY MOUTH IN THE EVENING, Disp: , Rfl:  .   valsartan-hydrochlorothiazide (DIOVAN-HCT) 160-12.5 MG tablet, Take by mouth., Disp: , Rfl:   ROS  Constitutional: Denies any fever or chills Gastrointestinal: No reported hemesis, hematochezia, vomiting, or acute GI distress  Musculoskeletal: Denies any acute onset joint swelling, redness, loss of ROM, or weakness Neurological: No reported episodes of acute onset apraxia, aphasia, dysarthria, agnosia, amnesia, paralysis, loss of coordination, or loss of consciousness  Allergies  Ms. Chancy is allergic to tape.  PFSH  Drug: Ms. Helmuth  reports that she does not use drugs. Alcohol:  reports that she does not drink alcohol. Tobacco:  reports that she quit smoking about 10 years ago. Her smoking use included cigarettes. she has never used smokeless tobacco. Medical:  has a past medical history of Allergy, Anxiety, Arthritis, Hyperlipidemia, and Hypertension. Surgical: Ms. Falkner  has a past surgical history that includes Joint replacement; Abdominal hysterectomy; and Wrist surgery. Family: family history includes Cancer in her father; Heart disease in her mother.  Constitutional Exam  General appearance: Well nourished, well developed, and well hydrated. In no apparent acute distress Vitals:   05/16/17 1319  BP: (!) 138/56  Pulse: 71  Resp: 16  Temp: 98.2 F (36.8 C)  SpO2: 99%  Weight: 220 lb (99.8 kg)  Height: 5' 5"  (1.651 m)   BMI Assessment: Estimated body mass index is 36.61 kg/m as calculated from the following:   Height as of this encounter: 5' 5"  (1.651 m).   Weight as of this encounter: 220 lb (99.8 kg).  BMI interpretation table: BMI level Category Range association with higher incidence of chronic pain  <18 kg/m2 Underweight   18.5-24.9 kg/m2 Ideal body weight   25-29.9 kg/m2 Overweight Increased incidence by 20%  30-34.9 kg/m2 Obese (Class I) Increased incidence by 68%  35-39.9 kg/m2 Severe obesity (Class II) Increased incidence by 136%  >40 kg/m2 Extreme  obesity (Class III) Increased incidence by 254%   BMI Readings from Last 4 Encounters:  05/16/17 36.61 kg/m  02/21/17 37.76 kg/m  01/17/17 37.76 kg/m  01/04/17 37.76 kg/m   Wt Readings from Last 4 Encounters:  05/16/17 220 lb (99.8 kg)  02/21/17 220 lb (99.8 kg)  01/17/17 220 lb (99.8 kg)  01/04/17 220 lb (99.8 kg)  Psych/Mental status: Alert, oriented x 3 (person, place, & time)       Eyes: PERLA Respiratory: No evidence of acute respiratory distress  Cervical Spine Area Exam  Skin & Axial Inspection: No masses, redness, edema, swelling, or associated skin lesions Alignment: Symmetrical Functional ROM: Unrestricted ROM      Stability: No instability detected Muscle Tone/Strength: Functionally intact. No obvious neuro-muscular anomalies detected. Sensory (Neurological): Unimpaired Palpation: No palpable anomalies              Upper Extremity (UE) Exam    Side: Right upper extremity  Side: Left upper extremity  Skin & Extremity Inspection: Skin color, temperature, and hair growth are WNL. No peripheral edema or cyanosis. No masses, redness, swelling, asymmetry, or associated skin lesions. No contractures.  Skin & Extremity Inspection: Skin color, temperature, and hair growth are WNL. No peripheral edema or cyanosis. No masses, redness, swelling, asymmetry, or associated skin lesions. No contractures.  Functional ROM: Unrestricted ROM          Functional ROM: Unrestricted ROM          Muscle Tone/Strength: Functionally intact. No obvious neuro-muscular anomalies detected.  Muscle Tone/Strength: Functionally intact. No obvious neuro-muscular anomalies detected.  Sensory (Neurological): Unimpaired          Sensory (Neurological): Unimpaired          Palpation: No palpable anomalies              Palpation:  No palpable anomalies              Specialized Test(s): Deferred         Specialized Test(s): Deferred          Thoracic Spine Area Exam  Skin & Axial Inspection: No masses,  redness, or swelling Alignment: Symmetrical Functional ROM: Unrestricted ROM Stability: No instability detected Muscle Tone/Strength: Functionally intact. No obvious neuro-muscular anomalies detected. Sensory (Neurological): Unimpaired Muscle strength & Tone: No palpable anomalies  Lumbar Spine Area Exam  Skin & Axial Inspection: No masses, redness, or swelling Alignment: Symmetrical Functional ROM: Unrestricted ROM      Stability: No instability detected Muscle Tone/Strength: Functionally intact. No obvious neuro-muscular anomalies detected. Sensory (Neurological): Unimpaired Palpation: No palpable anomalies       Provocative Tests: Lumbar Hyperextension and rotation test: evaluation deferred today       Lumbar Lateral bending test: evaluation deferred today       Patrick's Maneuver: evaluation deferred today                    Gait & Posture Assessment  Ambulation: Unassisted Gait: Relatively normal for age and body habitus Posture: WNL   Lower Extremity Exam    Side: Right lower extremity  Side: Left lower extremity  Skin & Extremity Inspection: Skin color, temperature, and hair growth are WNL. No peripheral edema or cyanosis. No masses, redness, swelling, asymmetry, or associated skin lesions. No contractures.  Skin & Extremity Inspection: Skin color, temperature, and hair growth are WNL. No peripheral edema or cyanosis. No masses, redness, swelling, asymmetry, or associated skin lesions. No contractures.  Functional ROM: Unrestricted ROM          Functional ROM: Unrestricted ROM          Muscle Tone/Strength: Functionally intact. No obvious neuro-muscular anomalies detected.  Muscle Tone/Strength: Functionally intact. No obvious neuro-muscular anomalies detected.  Sensory (Neurological): Unimpaired  Sensory (Neurological): Unimpaired  Palpation: No palpable anomalies  Palpation: No palpable anomalies   Assessment  Primary Diagnosis & Pertinent Problem List: The primary  encounter diagnosis was Primary osteoarthritis of both knees. Diagnoses of Chronic pain syndrome, History of bilateral knee replacement, Myofascial pain, and Lumbar spondylosis were also pertinent to this visit.  Status Diagnosis  Controlled Controlled Controlled 1. Primary osteoarthritis of both knees   2. Chronic pain syndrome   3. History of bilateral knee replacement   4. Myofascial pain   5. Lumbar spondylosis     General Recommendations: The pain condition that the patient suffers from is best treated with a multidisciplinary approach that involves an increase in physical activity to prevent de-conditioning and worsening of the pain cycle, as well as psychological counseling (formal and/or informal) to address the co-morbid psychological affects of pain. Treatment will often involve judicious use of pain medications and interventional procedures to decrease the pain, allowing the patient to participate in the physical activity that will ultimately produce long-lasting pain reductions. The goal of the multidisciplinary approach is to return the patient to a higher level of overall function and to restore their ability to perform activities of daily living.  72 y.o. old female with a past medical history significant for bilateral knee replacements who presents with bilateral knee pain. Patient states that she has had bilateral knee replacements done in [place] many years ago, left one was done in 2008 and right one was done in 2007. She states that these knee replacements were done "incorrectly". She states that her knees  are misaligned as a result of the surgery. Patient is status post bilateral genicular nerve block for bilateral knee pain on 01/04/17. She states that the block was moderately effective in decreasing her knee pain sx. Patient returns for follow-up.  Overall doing well.  She is established with a primary care physician.  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  Plan: -Hydrocodone 7.5 mg 3 times daily as needed,  quantity 90 a month, prescription for 3 months -Continue baclofen and gabapentin as previously prescribed -Follow-up in 35month.   Plan of Care  Pharmacotherapy (Medications Ordered): Meds ordered this encounter  Medications  . DISCONTD: HYDROcodone-acetaminophen (NORCO) 7.5-325 MG tablet    Sig: Take 1 tablet by mouth 3 (three) times daily as needed for moderate pain.    Dispense:  90 tablet    Refill:  0    For chronic pain To fill on or after: 05/17/17, 06/16/17, 07/16/17  . DISCONTD: HYDROcodone-acetaminophen (NORCO) 7.5-325 MG tablet    Sig: Take 1 tablet by mouth 3 (three) times daily as needed for moderate pain.    Dispense:  90 tablet    Refill:  0    For chronic pain To fill on or after: 05/17/17, 06/16/17, 07/16/17  . HYDROcodone-acetaminophen (NORCO) 7.5-325 MG tablet    Sig: Take 1 tablet by mouth 3 (three) times daily as needed for moderate pain.    Dispense:  90 tablet    Refill:  0    For chronic pain To fill on or after: 05/17/17, 06/16/17, 07/16/17    Considering:   -Bilateral genicular nerves block(first one 9/19 with >80% pain relief for approx 10-14 days), consider repeating with steroid or RFA -Intra-articular knee steroid injection -Lumbar medial branch blocks, L3-L5 bilaterally -bilateral cervical medial branch nerve blocks -Shoulder joint injection -SI joint injection      Provider-requested follow-up: Return in about 3 months (around 08/14/2017) for Medication Management. Time Note: Greater than 50% of the 25 minute(s) of face-to-face time spent with Ms. Bonini, was spent in counseling/coordination of care regarding: the appropriate use of the pain scale, opioid tolerance, the treatment plan, treatment alternatives, the risks and possible complications of proposed treatment, the opioid analgesic risks and possible complications, the appropriate use of her medications and the medication  agreement. Future Appointments  Date Time Provider [place] 08/08/2017  1:15 PM LGillis Santa [facility]    Primary Care Physician: Patient, No Pcp [facility] Note by: BGillis Santa M.D Date: 05/16/2017; Time: 3:03 PM  There are no Patient Instructions on file for this visit.

## 2017-05-16 NOTE — Progress Notes (Signed)
Nursing Pain Medication Assessment:  Safety precautions to be maintained throughout the outpatient stay will include: orient to surroundings, keep bed in low position, maintain call bell within reach at all times, provide assistance with transfer out of bed and ambulation.  Medication Inspection Compliance: Pill count conducted under aseptic conditions, in front of the patient. Neither the pills nor the bottle was removed from the patient's sight at any time. Once count was completed pills were immediately returned to the patient in their original bottle.  Medication: See above Pill/Patch Count: 9 of 90 pills remain Pill/Patch Appearance: Markings consistent with prescribed medication Bottle Appearance: Standard pharmacy container. Clearly labeled. Filled Date: 9 / 98 / 2018 Last Medication intake:  Today

## 2017-08-08 ENCOUNTER — Ambulatory Visit
Payer: Medicare HMO | Attending: Student in an Organized Health Care Education/Training Program | Admitting: Student in an Organized Health Care Education/Training Program

## 2017-08-08 ENCOUNTER — Other Ambulatory Visit: Payer: Self-pay

## 2017-08-08 ENCOUNTER — Encounter: Payer: Self-pay | Admitting: Student in an Organized Health Care Education/Training Program

## 2017-08-08 VITALS — BP 110/79 | HR 64 | Temp 98.4°F | Resp 18 | Ht 64.0 in | Wt 220.0 lb

## 2017-08-08 DIAGNOSIS — M25561 Pain in right knee: Secondary | ICD-10-CM | POA: Insufficient documentation

## 2017-08-08 DIAGNOSIS — K219 Gastro-esophageal reflux disease without esophagitis: Secondary | ICD-10-CM | POA: Diagnosis not present

## 2017-08-08 DIAGNOSIS — M25562 Pain in left knee: Secondary | ICD-10-CM | POA: Diagnosis not present

## 2017-08-08 DIAGNOSIS — M8588 Other specified disorders of bone density and structure, other site: Secondary | ICD-10-CM | POA: Insufficient documentation

## 2017-08-08 DIAGNOSIS — I10 Essential (primary) hypertension: Secondary | ICD-10-CM | POA: Insufficient documentation

## 2017-08-08 DIAGNOSIS — M545 Low back pain: Secondary | ICD-10-CM | POA: Diagnosis not present

## 2017-08-08 DIAGNOSIS — G894 Chronic pain syndrome: Secondary | ICD-10-CM | POA: Diagnosis not present

## 2017-08-08 DIAGNOSIS — M47816 Spondylosis without myelopathy or radiculopathy, lumbar region: Secondary | ICD-10-CM | POA: Diagnosis not present

## 2017-08-08 DIAGNOSIS — Z87891 Personal history of nicotine dependence: Secondary | ICD-10-CM | POA: Insufficient documentation

## 2017-08-08 DIAGNOSIS — M170 Bilateral primary osteoarthritis of knee: Secondary | ICD-10-CM

## 2017-08-08 DIAGNOSIS — Z5181 Encounter for therapeutic drug level monitoring: Secondary | ICD-10-CM | POA: Insufficient documentation

## 2017-08-08 DIAGNOSIS — F411 Generalized anxiety disorder: Secondary | ICD-10-CM | POA: Insufficient documentation

## 2017-08-08 DIAGNOSIS — Z79899 Other long term (current) drug therapy: Secondary | ICD-10-CM | POA: Diagnosis not present

## 2017-08-08 DIAGNOSIS — Z96653 Presence of artificial knee joint, bilateral: Secondary | ICD-10-CM

## 2017-08-08 DIAGNOSIS — Z90710 Acquired absence of both cervix and uterus: Secondary | ICD-10-CM | POA: Insufficient documentation

## 2017-08-08 DIAGNOSIS — E7800 Pure hypercholesterolemia, unspecified: Secondary | ICD-10-CM | POA: Diagnosis not present

## 2017-08-08 DIAGNOSIS — M7918 Myalgia, other site: Secondary | ICD-10-CM | POA: Diagnosis not present

## 2017-08-08 MED ORDER — HYDROCODONE-ACETAMINOPHEN 7.5-325 MG PO TABS: 1.0000 | ORAL_TABLET | Freq: Three times a day (TID) | ORAL | 0 refills | Status: DC | PRN

## 2017-08-08 NOTE — Progress Notes (Signed)
Nursing Pain Medication Assessment:  Safety precautions to be maintained throughout the outpatient stay will include: orient to surroundings, keep bed in low position, maintain call bell within reach at all times, provide assistance with transfer out of bed and ambulation.  Medication Inspection Compliance: Pill count conducted under aseptic conditions, in front of the patient. Neither the pills nor the bottle was removed from the patient's sight at any time. Once count was completed pills were immediately returned to the patient in their original bottle.  Medication: Hydrocodone/APAP Pill/Patch Count: 36 of 90 pills remain Pill/Patch Appearance: Markings consistent with prescribed medication Bottle Appearance: Standard pharmacy container. Clearly labeled. Filled Date: 04/ 06 / 2019 Last Medication intake:  Yesterday

## 2017-08-08 NOTE — Patient Instructions (Signed)
You were given 3 prescriptions for Hydrocodone today. 

## 2017-08-08 NOTE — Progress Notes (Signed)
Patient's Name: Cindy Rose  MRN: [id number]  Referring Provider: No ref. provider found  DOB: Aug 29, 1945  PCP: Patient, No Pcp Per  DOS: 08/08/2017  Note by: Gillis Santa, MD  Service setting: Ambulatory outpatient  Specialty: Interventional Pain Management  Location: [facility] (AMB) Pain Management Facility    Patient type: Established   Primary Reason(s) for Visit: Encounter for prescription drug management. (Level of risk: moderate)  CC: Back Pain (low) and Knee Pain (bilateral)  HPI  Cindy Rose is a 72 y.o. year old, female patient, who comes today for a medication management evaluation. She has Primary osteoarthritis of both knees; Chronic pain syndrome; History of bilateral knee replacement; Anxiety state; Myofascial pain; Lumbar spondylosis; Arthritis; Bilateral hearing loss; Essential hypertension; GERD (gastroesophageal reflux disease); Hypercholesterolemia; Osteopenia; and Spondylosis of lumbar region without myelopathy or radiculopathy on their problem list. Her primarily concern today is the Back Pain (low) and Knee Pain (bilateral)  Pain Assessment: Location: Lower Back Radiating: radiates upwards Onset: More than a month ago Duration: Chronic pain Quality: Aching Severity: 6 /10 (self-reported pain score)  Note: Reported level is compatible with observation.                         When using our objective Pain Scale, levels between 6 and 10/10 are said to belong in an emergency room, as it progressively worsens from a 6/10, described as severely limiting, requiring emergency care not usually available at an outpatient pain management facility. At a 6/10 level, communication becomes difficult and requires great effort. Assistance to reach the emergency department may be required. Facial flushing and profuse sweating along with potentially dangerous increases in heart rate and blood pressure will be evident. Effect on ADL:   Timing: Constant Modifying factors: medications, or  change in activity  Cindy Rose was last scheduled for an appointment on 05/16/2017 for medication management. During today's appointment we reviewed Cindy Rose's chronic pain status, as well as her outpatient medication regimen.  The patient  reports that she does not use drugs. Her body mass index is 37.76 kg/m.  Further details on both, my assessment(s), as well as the proposed treatment plan, please see below.  Controlled Substance Pharmacotherapy Assessment REMS (Risk Evaluation and Mitigation Strategy)  Analgesic:Hydrocodone 7.5 mg 3 times daily as needed, quantity 90 MME/day:Approximately 20-22m/day.  TDewayne Shorter RN  08/08/2017  1:21 PM  Sign at close encounter Nursing Pain Medication Assessment:  Safety precautions to be maintained throughout the outpatient stay will include: orient to surroundings, keep bed in low position, maintain call bell within reach at all times, provide assistance with transfer out of bed and ambulation.  Medication Inspection Compliance: Pill count conducted under aseptic conditions, in front of the patient. Neither the pills nor the bottle was removed from the patient's sight at any time. Once count was completed pills were immediately returned to the patient in their original bottle.  Medication: Hydrocodone/APAP Pill/Patch Count: 36 of 90 pills remain Pill/Patch Appearance: Markings consistent with prescribed medication Bottle Appearance: Standard pharmacy container. Clearly labeled. Filled Date: 04/ 06 / 2019 Last Medication intake:  Yesterday   Pharmacokinetics: Liberation and absorption (onset of action): WNL Distribution (time to peak effect): WNL Metabolism and excretion (duration of action): WNL         Pharmacodynamics: Desired effects: Analgesia: Cindy Rose >50% benefit. Functional ability: Patient reports that medication allows her to accomplish basic ADLs Clinically meaningful improvement in function (CMIF):  Sustained CMIF  goals met Perceived effectiveness: Described as relatively effective, allowing for increase in activities of daily living (ADL) Undesirable effects: Side-effects or Adverse reactions: None reported Monitoring: [place]: Online review of the past 54-monthperiod conducted. Compliant with practice rules and regulations Last UDS on record: Summary  Date Value Ref Range Status  12/06/2016 FINAL  Final    Comment:    ==================================================================== [facility] COMP DRUG ANALYSIS,UR ==================================================================== Test                             Result       Flag       Units Drug Present and Declared for Prescription Verification   Norhydrocodone                 625          EXPECTED   ng/mg creat    Norhydrocodone is an expected metabolite of hydrocodone.   Gabapentin                     PRESENT      EXPECTED   Acetaminophen                  PRESENT      EXPECTED Drug Present not Declared for Prescription Verification   Baclofen                       PRESENT      UNEXPECTED Drug Absent but Declared for Prescription Verification   Hydrocodone                    Not Detected UNEXPECTED ng/mg creat    Hydrocodone is almost always present in patients taking this drug    consistently. Absence of hydrocodone could be due to lapse of    time since the last dose or unusual pharmacokinetics (rapid    metabolism).   Carisoprodol                   Not Detected UNEXPECTED   Naproxen                       Not Detected UNEXPECTED ==================================================================== Test                      Result    Flag   Units      Ref Range   Creatinine              32               mg/dL      >=20 ==================================================================== Declared Medications:  The flagging and interpretation on this report are based on the  following declared medications.  Unexpected  results may arise from  inaccuracies in the declared medications.  **Note: The testing scope of this panel includes these medications:  Carisoprodol  Gabapentin  Hydrocodone (Hydrocodone-Acetaminophen)  Naproxen  **Note: The testing scope of this panel does not include small to  moderate amounts of these reported medications:  Acetaminophen (Hydrocodone-Acetaminophen)  **Note: The testing scope of this panel does not include following  reported medications:  Calcium (Calcium/Magnesium)  Celecoxib  Hydrochlorothiazide (Valsartan-Hydrochlorothizide)  Magnesium (Calcium/Magnesium)  Omeprazole  Valsartan (Valsartan-Hydrochlorothizide)  Zinc ==================================================================== For clinical consultation, please call ([phone] ====================================================================    UDS interpretation: Compliant          Medication Assessment  Form: Reviewed. Patient indicates being compliant with therapy Treatment compliance: Compliant Risk Assessment Profile: Aberrant behavior: See prior evaluations. None observed or detected today Comorbid factors increasing risk of overdose: See prior notes. No additional risks detected today Risk of substance use disorder (SUD): Low Opioid Risk Tool - 08/08/17 1319      Family History of Substance Abuse   Alcohol  Positive Female    Illegal Drugs  Negative    Rx Drugs  Negative      Personal History of Substance Abuse   Alcohol  Negative    Illegal Drugs  Negative    Rx Drugs  Negative      Age   Age between 85-45 years   No      History of Preadolescent Sexual Abuse   History of Preadolescent Sexual Abuse  Negative or Female      Psychological Disease   Psychological Disease  Negative    Depression  Negative      Total Score   Opioid Risk Tool Scoring  1    Opioid Risk Interpretation  Low Risk      ORT Scoring interpretation table:  Score <3 = Low Risk for SUD  Score between  4-7 = Moderate Risk for SUD  Score >8 = High Risk for Opioid Abuse   Risk Mitigation Strategies:  Patient Counseling: Covered Patient-Prescriber Agreement (PPA): Present and active  Notification to other healthcare providers: Done  Pharmacologic Plan: No change in therapy, at this time.             Laboratory Chemistry  Inflammation Markers (CRP: Acute Phase) (ESR: Chronic Phase) No results found for: CRP, ESRSEDRATE, LATICACIDVEN                       Rheumatology Markers No results found for: RF, ANA, LABURIC, URICUR, LYMEIGGIGMAB, LYMEABIGMQN                      Renal Function Markers No results found for: BUN, CREATININE, GFRAA, GFRNONAA                            Hepatic Function Markers No results found for: AST, ALT, ALBUMIN, ALKPHOS, HCVAB, AMYLASE, LIPASE, AMMONIA                      Electrolytes No results found for: NA, K, CL, CALCIUM, MG, PHOS                      Neuropathy Markers No results found for: VITAMINB12, FOLATE, HGBA1C, HIV                      Bone Pathology Markers No results found for: VD25OH, [id number], [id number], [id number], 25OHVITD1, 25OHVITD2, 25OHVITD3, TESTOFREE, TESTOSTERONE                       Coagulation Parameters No results found for: INR, LABPROT, APTT, PLT, DDIMER                      Cardiovascular Markers No results found for: BNP, CKTOTAL, CKMB, TROPONINI, HGB, HCT                       CA Markers No results found for: CEA, CA125, LABCA2  Note: Lab results reviewed.  Recent Diagnostic Imaging Results  DG C-Arm 1-60 Min-No Report Fluoroscopy was utilized by the requesting physician.  No radiographic  interpretation.   Complexity Note: Imaging results reviewed. Results shared with Cindy Rose, using Layman's terms.                         Meds   Current Outpatient Medications:  .  atorvastatin (LIPITOR) 40 MG tablet, Take 40 mg by mouth., Disp: , Rfl:  .  baclofen (LIORESAL) 10 MG tablet, Take  1 tablet (10 mg total) 3 (three) times daily as needed by mouth for muscle spasms., Disp: 90 tablet, Rfl: 6 .  CALCIUM-MAGNESIUM-ZINC PO, Take by mouth., Disp: , Rfl:  .  gabapentin (NEURONTIN) 300 MG capsule, Take 1 capsule (300 mg total) 2 (two) times daily by mouth., Disp: 60 capsule, Rfl: 6 .  HYDROcodone-acetaminophen (NORCO) 7.5-325 MG tablet, Take 1 tablet by mouth 3 (three) times daily as needed for moderate pain., Disp: 90 tablet, Rfl: 0 .  losartan (COZAAR) 50 MG tablet, Take by mouth., Disp: , Rfl:  .  naproxen (NAPROSYN) 500 MG tablet, Take by mouth., Disp: , Rfl:  .  omeprazole (PRILOSEC) 40 MG capsule, TAKE ONE CAPSULE BY MOUTH ONCE DAILY, Disp: , Rfl:  .  valsartan-hydrochlorothiazide (DIOVAN-HCT) 160-12.5 MG tablet, TAKE ONE TABLET BY MOUTH IN THE EVENING, Disp: , Rfl:  .  celecoxib (CELEBREX) 200 MG capsule, TAKE ONE CAPSULE BY MOUTH ONCE DAILY, Disp: , Rfl:  .  omeprazole (PRILOSEC) 40 MG capsule, Take 40 mg by mouth., Disp: , Rfl:  .  valsartan-hydrochlorothiazide (DIOVAN-HCT) 160-12.5 MG tablet, Take by mouth., Disp: , Rfl:   ROS  Constitutional: Denies any fever or chills Gastrointestinal: No reported hemesis, hematochezia, vomiting, or acute GI distress Musculoskeletal: Denies any acute onset joint swelling, redness, loss of ROM, or weakness Neurological: No reported episodes of acute onset apraxia, aphasia, dysarthria, agnosia, amnesia, paralysis, loss of coordination, or loss of consciousness  Allergies  Cindy Rose is allergic to tape and zoster vaccine live.  PFSH  Drug: Cindy Rose  reports that she does not use drugs. Alcohol:  reports that she does not drink alcohol. Tobacco:  reports that she quit smoking about 10 years ago. Her smoking use included cigarettes. She has never used smokeless tobacco. Medical:  has a past medical history of Allergy, Anxiety, Arthritis, Hyperlipidemia, and Hypertension. Surgical: Cindy Rose  has a past surgical history that  includes Joint replacement; Abdominal hysterectomy; and Wrist surgery. Family: family history includes Cancer in her father; Heart disease in her mother.  Constitutional Exam  General appearance: Well nourished, well developed, and well hydrated. In no apparent acute distress Vitals:   08/08/17 1313 08/08/17 1315  BP:  110/79  Pulse: 64   Resp: 18   Temp: 98.4 F (36.9 C)   SpO2: 98%   Weight: 220 lb (99.8 kg)   Height: 5' 4"  (1.626 m)    BMI Assessment: Estimated body mass index is 37.76 kg/m as calculated from the following:   Height as of this encounter: 5' 4"  (1.626 m).   Weight as of this encounter: 220 lb (99.8 kg).  BMI interpretation table: BMI level Category Range association with higher incidence of chronic pain  <18 kg/m2 Underweight   18.5-24.9 kg/m2 Ideal body weight   25-29.9 kg/m2 Overweight Increased incidence by 20%  30-34.9 kg/m2 Obese (Class I) Increased incidence by 68%  35-39.9 kg/m2 Severe obesity (Class  II) Increased incidence by 136%  >40 kg/m2 Extreme obesity (Class III) Increased incidence by 254%   BMI Readings from Last 4 Encounters:  08/08/17 37.76 kg/m  05/16/17 36.61 kg/m  02/21/17 37.76 kg/m  01/17/17 37.76 kg/m   Wt Readings from Last 4 Encounters:  08/08/17 220 lb (99.8 kg)  05/16/17 220 lb (99.8 kg)  02/21/17 220 lb (99.8 kg)  01/17/17 220 lb (99.8 kg)  Psych/Mental status: Alert, oriented x 3 (person, place, & time)       Eyes: PERLA Respiratory: No evidence of acute respiratory distress  Cervical Spine Area Exam  Skin & Axial Inspection: No masses, redness, edema, swelling, or associated skin lesions Alignment: Symmetrical Functional ROM: Unrestricted ROM      Stability: No instability detected Muscle Tone/Strength: Functionally intact. No obvious neuro-muscular anomalies detected. Sensory (Neurological): Unimpaired Palpation: No palpable anomalies              Upper Extremity (UE) Exam    Side: Right upper extremity   Side: Left upper extremity  Skin & Extremity Inspection: Skin color, temperature, and hair growth are WNL. No peripheral edema or cyanosis. No masses, redness, swelling, asymmetry, or associated skin lesions. No contractures.  Skin & Extremity Inspection: Skin color, temperature, and hair growth are WNL. No peripheral edema or cyanosis. No masses, redness, swelling, asymmetry, or associated skin lesions. No contractures.  Functional ROM: Unrestricted ROM          Functional ROM: Unrestricted ROM          Muscle Tone/Strength: Functionally intact. No obvious neuro-muscular anomalies detected.  Muscle Tone/Strength: Functionally intact. No obvious neuro-muscular anomalies detected.  Sensory (Neurological): Unimpaired          Sensory (Neurological): Unimpaired          Palpation: No palpable anomalies              Palpation: No palpable anomalies              Specialized Test(s): Deferred         Specialized Test(s): Deferred          Thoracic Spine Area Exam  Skin & Axial Inspection: No masses, redness, or swelling Alignment: Symmetrical Functional ROM: Unrestricted ROM Stability: No instability detected Muscle Tone/Strength: Functionally intact. No obvious neuro-muscular anomalies detected. Sensory (Neurological): Unimpaired Muscle strength & Tone: No palpable anomalies  Lumbar Spine Area Exam  Skin & Axial Inspection: No masses, redness, or swelling Alignment: Symmetrical Functional ROM: Unrestricted ROM       Stability: No instability detected Muscle Tone/Strength: Functionally intact. No obvious neuro-muscular anomalies detected. Sensory (Neurological): Unimpaired Palpation: No palpable anomalies       Provocative Tests: Lumbar Hyperextension and rotation test: evaluation deferred today       Lumbar Lateral bending test: evaluation deferred today       Patrick's Maneuver: evaluation deferred today                    Gait & Posture Assessment  Ambulation: Unassisted Gait: Relatively  normal for age and body habitus Posture: WNL   Lower Extremity Exam    Side: Right lower extremity  Side: Left lower extremity  Skin & Extremity Inspection: Skin color, temperature, and hair growth are WNL. No peripheral edema or cyanosis. No masses, redness, swelling, asymmetry, or associated skin lesions. No contractures.  Skin & Extremity Inspection: Skin color, temperature, and hair growth are WNL. No peripheral edema or cyanosis. No masses, redness, swelling, asymmetry,  or associated skin lesions. No contractures.  Functional ROM: Unrestricted ROM          Functional ROM: Unrestricted ROM          Muscle Tone/Strength: Functionally intact. No obvious neuro-muscular anomalies detected.  Muscle Tone/Strength: Functionally intact. No obvious neuro-muscular anomalies detected.  Sensory (Neurological): Unimpaired  Sensory (Neurological): Unimpaired  Palpation: No palpable anomalies  Palpation: No palpable anomalies   Assessment  Primary Diagnosis & Pertinent Problem List: The primary encounter diagnosis was Primary osteoarthritis of both knees. Diagnoses of History of bilateral knee replacement, Myofascial pain, Lumbar spondylosis, and Chronic pain syndrome were also pertinent to this visit.  Status Diagnosis  Controlled Controlled Controlled 1. Primary osteoarthritis of both knees   2. History of bilateral knee replacement   3. Myofascial pain   4. Lumbar spondylosis   5. Chronic pain syndrome      General Recommendations: The pain condition that the patient suffers from is best treated with a multidisciplinary approach that involves an increase in physical activity to prevent de-conditioning and worsening of the pain cycle, as well as psychological counseling (formal and/or informal) to address the co-morbid psychological affects of pain. Treatment will often involve judicious use of pain medications and interventional procedures to decrease the pain, allowing the patient to participate in  the physical activity that will ultimately produce long-lasting pain reductions. The goal of the multidisciplinary approach is to return the patient to a higher level of overall function and to restore their ability to perform activities of daily living.  72 y.o. old female with a past medical history significant for bilateral knee replacements who presents with bilateral knee pain. Patient states that she has had bilateral knee replacements done in [place] many years ago, left one was done in 2008 and right one was done in 2007. She states that these knee replacements were done "incorrectly". She states that her knees are misaligned as a result of the surgery. Patient is status post bilateral genicular nerve block for bilateral knee painon 01/04/17 which was effective for her knee pain.  She returns today for follow-up for medication refill.  States that her pain is at baseline.  No significant changes in her medical history.  Has been compliant with therapy.  Plan: -Refill hydrocodone 7.5 mg 3 times daily as needed as below.  Prescription provided for 3 months. -Continue baclofen and gabapentin as prescribed.   Plan of Care  Pharmacotherapy (Medications Ordered): Meds ordered this encounter  Medications  . DISCONTD: HYDROcodone-acetaminophen (NORCO) 7.5-325 MG tablet    Sig: Take 1 tablet by mouth 3 (three) times daily as needed for moderate pain.    Dispense:  90 tablet    Refill:  0    For chronic pain To fill on or after: 08/20/17, 09/19/17, 10/18/17  . DISCONTD: HYDROcodone-acetaminophen (NORCO) 7.5-325 MG tablet    Sig: Take 1 tablet by mouth 3 (three) times daily as needed for moderate pain.    Dispense:  90 tablet    Refill:  0    For chronic pain To fill on or after: 08/20/17, 09/19/17, 10/18/17  . HYDROcodone-acetaminophen (NORCO) 7.5-325 MG tablet    Sig: Take 1 tablet by mouth 3 (three) times daily as needed for moderate pain.    Dispense:  90 tablet    Refill:  0    For chronic  pain To fill on or after: 08/20/17, 09/19/17, 10/18/17   Considering: -Bilateral genicular nerves block(first one 9/19 with >80% pain relief for approx  10-14 days), consider repeating with steroid or RFA -Intra-articular knee steroid injection -Lumbar medial branch blocks, L3-L5 bilaterally -bilateral cervical medial branch nerve blocks -Shoulder joint injection -SI joint injection  Provider-requested follow-up: Return in about 3 months (around 11/07/2017) for Medication Management. Time Note: Greater than 50% of the 25 minute(s) of face-to-face time spent with Cindy Rose, was spent in counseling/coordination of care regarding: the appropriate use of the pain scale, Cindy Rose's primary cause of pain, medication side effects, the opioid analgesic risks and possible complications, the appropriate use of her medications, realistic expectations, the medication agreement and the patient's responsibilities when it comes to controlled substances. Future Appointments  Date Time Provider [employer]  10/31/2017  2:00 PM Gillis Santa, MD [facility] None    Primary Care Physician: Patient, No Pcp Per Location: [facility] Facility Note by: Gillis Santa, M.D Date: 08/08/2017; Time: 2:44 PM  Patient Instructions  You were given 3 prescriptions for Hydrocodone today.

## 2017-09-16 DIAGNOSIS — C801 Malignant (primary) neoplasm, unspecified: Secondary | ICD-10-CM

## 2017-09-16 HISTORY — DX: Malignant (primary) neoplasm, unspecified: C80.1

## 2017-09-22 ENCOUNTER — Other Ambulatory Visit: Payer: Self-pay | Admitting: Student in an Organized Health Care Education/Training Program

## 2017-10-31 ENCOUNTER — Encounter: Payer: Medicare HMO | Admitting: Student in an Organized Health Care Education/Training Program

## 2017-11-15 DIAGNOSIS — C189 Malignant neoplasm of colon, unspecified: Secondary | ICD-10-CM

## 2017-11-15 DIAGNOSIS — C182 Malignant neoplasm of ascending colon: Secondary | ICD-10-CM | POA: Insufficient documentation

## 2017-11-28 ENCOUNTER — Encounter: Payer: Medicare HMO | Admitting: Student in an Organized Health Care Education/Training Program

## 2017-12-06 ENCOUNTER — Encounter: Payer: Self-pay | Admitting: Student in an Organized Health Care Education/Training Program

## 2017-12-06 ENCOUNTER — Ambulatory Visit
Payer: Medicare HMO | Attending: Student in an Organized Health Care Education/Training Program | Admitting: Student in an Organized Health Care Education/Training Program

## 2017-12-06 ENCOUNTER — Other Ambulatory Visit: Payer: Self-pay

## 2017-12-06 VITALS — BP 116/69 | HR 88 | Temp 99.1°F | Ht 64.0 in | Wt 213.0 lb

## 2017-12-06 DIAGNOSIS — M25562 Pain in left knee: Secondary | ICD-10-CM | POA: Insufficient documentation

## 2017-12-06 DIAGNOSIS — M25561 Pain in right knee: Secondary | ICD-10-CM | POA: Diagnosis present

## 2017-12-06 DIAGNOSIS — M170 Bilateral primary osteoarthritis of knee: Secondary | ICD-10-CM

## 2017-12-06 DIAGNOSIS — M8580 Other specified disorders of bone density and structure, unspecified site: Secondary | ICD-10-CM | POA: Insufficient documentation

## 2017-12-06 DIAGNOSIS — I10 Essential (primary) hypertension: Secondary | ICD-10-CM | POA: Insufficient documentation

## 2017-12-06 DIAGNOSIS — H9193 Unspecified hearing loss, bilateral: Secondary | ICD-10-CM | POA: Diagnosis not present

## 2017-12-06 DIAGNOSIS — C182 Malignant neoplasm of ascending colon: Secondary | ICD-10-CM | POA: Insufficient documentation

## 2017-12-06 DIAGNOSIS — Z79899 Other long term (current) drug therapy: Secondary | ICD-10-CM | POA: Diagnosis not present

## 2017-12-06 DIAGNOSIS — Z87891 Personal history of nicotine dependence: Secondary | ICD-10-CM | POA: Insufficient documentation

## 2017-12-06 DIAGNOSIS — M7918 Myalgia, other site: Secondary | ICD-10-CM | POA: Insufficient documentation

## 2017-12-06 DIAGNOSIS — M47816 Spondylosis without myelopathy or radiculopathy, lumbar region: Secondary | ICD-10-CM | POA: Diagnosis not present

## 2017-12-06 DIAGNOSIS — M545 Low back pain: Secondary | ICD-10-CM | POA: Insufficient documentation

## 2017-12-06 DIAGNOSIS — E7800 Pure hypercholesterolemia, unspecified: Secondary | ICD-10-CM | POA: Insufficient documentation

## 2017-12-06 DIAGNOSIS — Z96653 Presence of artificial knee joint, bilateral: Secondary | ICD-10-CM | POA: Diagnosis not present

## 2017-12-06 DIAGNOSIS — G894 Chronic pain syndrome: Secondary | ICD-10-CM

## 2017-12-06 DIAGNOSIS — K219 Gastro-esophageal reflux disease without esophagitis: Secondary | ICD-10-CM | POA: Diagnosis not present

## 2017-12-06 MED ORDER — HYDROCODONE-ACETAMINOPHEN 7.5-325 MG PO TABS: 1.0000 | ORAL_TABLET | Freq: Three times a day (TID) | ORAL | 0 refills | Status: AC | PRN

## 2017-12-06 MED ORDER — HYDROCODONE-ACETAMINOPHEN 7.5-325 MG PO TABS: 1.0000 | ORAL_TABLET | Freq: Three times a day (TID) | ORAL | 0 refills | Status: DC | PRN

## 2017-12-06 NOTE — Progress Notes (Signed)
Patient's Name: Cindy Rose  MRN: [id number]  Referring Provider: No ref. provider found  DOB: [birth year]  PCP: Patient, No Pcp Per  DOS: 12/06/2017  Note by: Gillis Santa, MD  Service setting: Ambulatory outpatient  Specialty: Interventional Pain Management  Location: [facility] (AMB) Pain Management Facility    Patient type: Established   Primary Reason(s) for Visit: Encounter for prescription drug management. (Level of risk: moderate)  CC: Knee Pain (blateral- radiates to shins) and Back Pain (low)  HPI  [name] is a 72 y.o. year old, female patient, who comes today for a medication management evaluation. She has Primary osteoarthritis of both knees; Chronic pain syndrome; History of bilateral knee replacement; Anxiety state; Myofascial pain; Lumbar spondylosis; Arthritis; Bilateral hearing loss; Essential hypertension; GERD (gastroesophageal reflux disease); Hypercholesterolemia; Osteopenia; Spondylosis of lumbar region without myelopathy or radiculopathy; and Colon cancer ([place]) on their problem list. Her primarily concern today is the Knee Pain (blateral- radiates to shins) and Back Pain (low)  Pain Assessment: Location: Lower Back Radiating: knees, shins Onset: More than a month ago Duration: Chronic pain Quality: Constant, Aching, Burning Severity: 6 /10 (subjective, self-reported pain score)  Note: Reported level is compatible with observation.                         When using our objective Pain Scale, levels between 6 and 10/10 are said to belong in an emergency room, as it progressively worsens from a 6/10, described as severely limiting, requiring emergency care not usually available at an outpatient pain management facility. At a 6/10 level, communication becomes difficult and requires great effort. Assistance to reach the emergency department may be required. Facial flushing and profuse sweating along with potentially dangerous increases in heart rate and blood pressure will be  evident. Effect on ADL:   Timing: Constant Modifying factors: medications BP: 116/69  HR: 88  [name] was last scheduled for an appointment on 09/22/2017 for medication management. During today's appointment we reviewed [name]'s chronic pain status, as well as her outpatient medication regimen.  Of note, patient was diagnosed with T3N0 stage II colon cancer s/p right hemicolectomy on October 17, 2017.  Patient states that she is recovering from surgery.  She states that her postoperative course was complicated by nausea and small bowel difficulty.  The patient  reports that she does not use drugs. Her body mass index is 36.56 kg/m.  Further details on both, my assessment(s), as well as the proposed treatment plan, please see below.  Controlled Substance Pharmacotherapy Assessment REMS (Risk Evaluation and Mitigation Strategy)  Analgesic:Hydrocodone 7.5 mg 3 times daily as needed, quantity 90 MME/day:Approximately 20-'25mg'$ /day. Hart [place], RN  12/06/2017  1:28 PM  Sign at close encounter Nursing Pain Medication Assessment:  Safety precautions to be maintained throughout the outpatient stay will include: orient to surroundings, keep bed in low position, maintain call bell within reach at all times, provide assistance with transfer out of bed and ambulation.  Medication Inspection Compliance: Pill count conducted under aseptic conditions, in front of the patient. Neither the pills nor the bottle was removed from the patient's sight at any time. Once count was completed pills were immediately returned to the patient in their original bottle.  Medication: Hydrocodone/APAP Pill/Patch Count: 0 of 90 pills remain Pill/Patch Appearance: Markings consistent with prescribed medication Bottle Appearance: Standard pharmacy container. Clearly labeled. Filled Date: 07 / 09 / 2019 Last Medication intake:  Ran out of medicine more than  48 hours ago   Pharmacokinetics: Liberation and  absorption (onset of action): WNL Distribution (time to peak effect): WNL Metabolism and excretion (duration of action): WNL         Pharmacodynamics: Desired effects: Analgesia: [name] reports >50% benefit. Functional ability: Patient reports that medication allows her to accomplish basic ADLs Clinically meaningful improvement in function (CMIF): Sustained CMIF goals met Perceived effectiveness: Described as relatively effective, allowing for increase in activities of daily living (ADL) Undesirable effects: Side-effects or Adverse reactions: None reported Monitoring: [place] PMP: Online review of the past 81-monthperiod conducted. Compliant with practice rules and regulations Last UDS on record: Summary  Date Value Ref Range Status  12/06/2016 FINAL  Final    Comment:    ==================================================================== [facility] COMP DRUG ANALYSIS,UR ==================================================================== Test                             Result       Flag       Units Drug Present and Declared for Prescription Verification   Norhydrocodone                 625          EXPECTED   ng/mg creat    Norhydrocodone is an expected metabolite of hydrocodone.   Gabapentin                     PRESENT      EXPECTED   Acetaminophen                  PRESENT      EXPECTED Drug Present not Declared for Prescription Verification   Baclofen                       PRESENT      UNEXPECTED Drug Absent but Declared for Prescription Verification   Hydrocodone                    Not Detected UNEXPECTED ng/mg creat    Hydrocodone is almost always present in patients taking this drug    consistently. Absence of hydrocodone could be due to lapse of    time since the last dose or unusual pharmacokinetics (rapid    metabolism).   Carisoprodol                   Not Detected UNEXPECTED   Naproxen                       Not Detected  UNEXPECTED ==================================================================== Test                      Result    Flag   Units      Ref Range   Creatinine              32               mg/dL      >=20 ==================================================================== Declared Medications:  The flagging and interpretation on this report are based on the  following declared medications.  Unexpected results may arise from  inaccuracies in the declared medications.  **Note: The testing scope of this panel includes these medications:  Carisoprodol  Gabapentin  Hydrocodone (Hydrocodone-Acetaminophen)  Naproxen  **Note: The testing scope of this panel does not include small to  moderate amounts of these  reported medications:  Acetaminophen (Hydrocodone-Acetaminophen)  **Note: The testing scope of this panel does not include following  reported medications:  Calcium (Calcium/Magnesium)  Celecoxib  Hydrochlorothiazide (Valsartan-Hydrochlorothizide)  Magnesium (Calcium/Magnesium)  Omeprazole  Valsartan (Valsartan-Hydrochlorothizide)  Zinc ==================================================================== For clinical consultation, please call [phone]. ====================================================================    UDS interpretation: Compliant          Medication Assessment Form: Reviewed. Patient indicates being compliant with therapy Treatment compliance: Compliant Risk Assessment Profile: Aberrant behavior: See prior evaluations. None observed or detected today Comorbid factors increasing risk of overdose: See prior notes. No additional risks detected today Opioid risk tool (ORT) (Total Score): 3 Personal History of Substance Abuse (SUD-Substance use disorder):  Alcohol: Negative  Illegal Drugs: Negative  Rx Drugs: Negative  ORT Risk Level calculation: Low Risk Risk of substance use disorder (SUD): Low Opioid Risk Tool - 12/06/17 1326      Family History of  Substance Abuse   Alcohol  Positive Female    Illegal Drugs  Positive Female    Rx Drugs  Negative      Personal History of Substance Abuse   Alcohol  Negative    Illegal Drugs  Negative    Rx Drugs  Negative      Age   Age between 74-45 years   No      History of Preadolescent Sexual Abuse   History of Preadolescent Sexual Abuse  Negative or Female      Psychological Disease   Psychological Disease  Negative    Depression  Negative      Total Score   Opioid Risk Tool Scoring  3    Opioid Risk Interpretation  Low Risk      ORT Scoring interpretation table:  Score <3 = Low Risk for SUD  Score between 4-7 = Moderate Risk for SUD  Score >8 = High Risk for Opioid Abuse   Risk Mitigation Strategies:  Patient Counseling: Covered Patient-Prescriber Agreement (PPA): Present and active  Notification to other healthcare providers: Done  Pharmacologic Plan: No change in therapy, at this time.             Laboratory Chemistry  Inflammation Markers (CRP: Acute Phase) (ESR: Chronic Phase) No results found for: CRP, ESRSEDRATE, LATICACIDVEN                       Rheumatology Markers No results found for: RF, ANA, LABURIC, URICUR, LYMEIGGIGMAB, LYMEABIGMQN, HLAB27                      Renal Function Markers No results found for: BUN, CREATININE, BCR, GFRAA, GFRNONAA                           Hepatic Function Markers No results found for: AST, ALT, ALBUMIN, ALKPHOS, HCVAB, AMYLASE, LIPASE, AMMONIA                      Electrolytes No results found for: NA, K, CL, CALCIUM, MG, PHOS                      Neuropathy Markers No results found for: VITAMINB12, FOLATE, HGBA1C, HIV                      Bone Pathology Markers No results found for: Aneta, [id number], [id number], [id number], 25OHVITD1, 25OHVITD2, 25OHVITD3, TESTOFREE, TESTOSTERONE  Coagulation Parameters No results found for: INR, LABPROT, APTT, PLT, DDIMER                      Cardiovascular  Markers No results found for: BNP, CKTOTAL, CKMB, TROPONINI, HGB, HCT                       CA Markers No results found for: CEA, CA125, LABCA2                      Note: Lab results reviewed.  Recent Diagnostic Imaging Results  DG C-Arm 1-60 Min-No Report Fluoroscopy was utilized by the requesting physician.  No radiographic  interpretation.   Complexity Note: Imaging results reviewed. Results shared with Ms. Linville, using Layman's terms.                         Meds   Current Outpatient Medications:  .  atorvastatin (LIPITOR) 40 MG tablet, Take 40 mg by mouth., Disp: , Rfl:  .  baclofen (LIORESAL) 10 MG tablet, Take 1 tablet (10 mg total) 3 (three) times daily as needed by mouth for muscle spasms., Disp: 90 tablet, Rfl: 6 .  CALCIUM-MAGNESIUM-ZINC PO, Take by mouth., Disp: , Rfl:  .  gabapentin (NEURONTIN) 300 MG capsule, Take 1 capsule (300 mg total) 2 (two) times daily by mouth., Disp: 60 capsule, Rfl: 6 .  HYDROcodone-acetaminophen (NORCO) 7.5-325 MG tablet, Take 1 tablet by mouth 3 (three) times daily as needed for moderate pain., Disp: 90 tablet, Rfl: 0 .  losartan (COZAAR) 50 MG tablet, Take by mouth., Disp: , Rfl:  .  naproxen (NAPROSYN) 500 MG tablet, Take by mouth., Disp: , Rfl:  .  omeprazole (PRILOSEC) 40 MG capsule, Take 40 mg by mouth., Disp: , Rfl:  .  valsartan-hydrochlorothiazide (DIOVAN-HCT) 160-12.5 MG tablet, TAKE ONE TABLET BY MOUTH IN THE EVENING, Disp: , Rfl:  .  [START ON 01/05/2018] HYDROcodone-acetaminophen (NORCO) 7.5-325 MG tablet, Take 1 tablet by mouth 3 (three) times daily as needed for moderate pain., Disp: 90 tablet, Rfl: 0 .  [START ON 02/04/2018] HYDROcodone-acetaminophen (NORCO) 7.5-325 MG tablet, Take 1 tablet by mouth 3 (three) times daily as needed for moderate pain., Disp: 90 tablet, Rfl: 0 .  omeprazole (PRILOSEC) 40 MG capsule, TAKE ONE CAPSULE BY MOUTH ONCE DAILY, Disp: , Rfl:  .  valsartan-hydrochlorothiazide (DIOVAN-HCT) 160-12.5 MG  tablet, Take by mouth., Disp: , Rfl:   ROS  Constitutional: Denies any fever or chills Gastrointestinal: No reported hemesis, hematochezia, vomiting, or acute GI distress Musculoskeletal: Denies any acute onset joint swelling, redness, loss of ROM, or weakness Neurological: No reported episodes of acute onset apraxia, aphasia, dysarthria, agnosia, amnesia, paralysis, loss of coordination, or loss of consciousness  Allergies  Ms. Glauber is allergic to tape and zoster vaccine live.  PFSH  Drug: Ms. Rayson  reports that she does not use drugs. Alcohol:  reports that she does not drink alcohol. Tobacco:  reports that she quit smoking about 11 years ago. Her smoking use included cigarettes. She has never used smokeless tobacco. Medical:  has a past medical history of Allergy, Anxiety, Arthritis, Cancer ([place]) (09/2017), Hyperlipidemia, and Hypertension. Surgical: Ms. Barwick  has a past surgical history that includes Joint replacement; Abdominal hysterectomy; and Wrist surgery. Family: family history includes Cancer in her father; Heart disease in her mother.  Constitutional Exam  General appearance: Well nourished, well developed, and well  hydrated. In no apparent acute distress Vitals:   12/06/17 1318  BP: 116/69  Pulse: 88  Temp: 99.1 F (37.3 C)  TempSrc: Oral  Weight: 213 lb (96.6 kg)  Height: '5\' 4"'$  (1.626 m)   BMI Assessment: Estimated body mass index is 36.56 kg/m as calculated from the following:   Height as of this encounter: '5\' 4"'$  (1.626 m).   Weight as of this encounter: 213 lb (96.6 kg).  BMI interpretation table: BMI level Category Range association with higher incidence of chronic pain  <18 kg/m2 Underweight   18.5-24.9 kg/m2 Ideal body weight   25-29.9 kg/m2 Overweight Increased incidence by 20%  30-34.9 kg/m2 Obese (Class I) Increased incidence by 68%  35-39.9 kg/m2 Severe obesity (Class II) Increased incidence by 136%  >40 kg/m2 Extreme obesity (Class  III) Increased incidence by 254%   Patient's current BMI Ideal Body weight  Body mass index is 36.56 kg/m. Ideal body weight: 54.7 kg (120 lb 9.5 oz) Adjusted ideal body weight: 71.5 kg (157 lb 8.9 oz)   BMI Readings from Last 4 Encounters:  12/06/17 36.56 kg/m  08/08/17 37.76 kg/m  05/16/17 36.61 kg/m  02/21/17 37.76 kg/m   Wt Readings from Last 4 Encounters:  12/06/17 213 lb (96.6 kg)  08/08/17 220 lb (99.8 kg)  05/16/17 220 lb (99.8 kg)  02/21/17 220 lb (99.8 kg)  Psych/Mental status: Alert, oriented x 3 (person, place, & time)       Eyes: PERLA Respiratory: No evidence of acute respiratory distress  Cervical Spine Area Exam  Skin & Axial Inspection: No masses, redness, edema, swelling, or associated skin lesions Alignment: Symmetrical Functional ROM: Unrestricted ROM      Stability: No instability detected Muscle Tone/Strength: Functionally intact. No obvious neuro-muscular anomalies detected. Sensory (Neurological): Unimpaired Palpation: No palpable anomalies              Upper Extremity (UE) Exam    Side: Right upper extremity  Side: Left upper extremity  Skin & Extremity Inspection: Skin color, temperature, and hair growth are WNL. No peripheral edema or cyanosis. No masses, redness, swelling, asymmetry, or associated skin lesions. No contractures.  Skin & Extremity Inspection: Skin color, temperature, and hair growth are WNL. No peripheral edema or cyanosis. No masses, redness, swelling, asymmetry, or associated skin lesions. No contractures.  Functional ROM: Unrestricted ROM          Functional ROM: Unrestricted ROM          Muscle Tone/Strength: Functionally intact. No obvious neuro-muscular anomalies detected.  Muscle Tone/Strength: Functionally intact. No obvious neuro-muscular anomalies detected.  Sensory (Neurological): Unimpaired          Sensory (Neurological): Unimpaired          Palpation: No palpable anomalies              Palpation: No palpable anomalies               Provocative Test(s):  Phalen's test: deferred Tinel's test: deferred Apley's scratch test (touch opposite shoulder):  Action 1 (Across chest): deferred Action 2 (Overhead): deferred Action 3 (LB reach): deferred   Provocative Test(s):  Phalen's test: deferred Tinel's test: deferred Apley's scratch test (touch opposite shoulder):  Action 1 (Across chest): deferred Action 2 (Overhead): deferred Action 3 (LB reach): deferred    Thoracic Spine Area Exam  Skin & Axial Inspection: No masses, redness, or swelling Alignment: Symmetrical Functional ROM: Unrestricted ROM Stability: No instability detected Muscle Tone/Strength: Functionally intact. No obvious neuro-muscular anomalies  detected. Sensory (Neurological): Unimpaired Muscle strength & Tone: No palpable anomalies  Lumbar Spine Area Exam  Skin & Axial Inspection: No masses, redness, or swelling Alignment: Symmetrical Functional ROM: Unrestricted ROM       Stability: No instability detected Muscle Tone/Strength: Functionally intact. No obvious neuro-muscular anomalies detected. Sensory (Neurological): Unimpaired Palpation: No palpable anomalies       Provocative Tests: Hyperextension/rotation test: deferred today       Lumbar quadrant test (Kemp's test): deferred today       Lateral bending test: deferred today       Patrick's Maneuver: deferred today                   FABER test: deferred today                   S-I anterior distraction/compression test: deferred today         S-I lateral compression test: deferred today         S-I Thigh-thrust test: deferred today         S-I Gaenslen's test: deferred today          Gait & Posture Assessment  Ambulation: Unassisted Gait: Relatively normal for age and body habitus Posture: WNL   Lower Extremity Exam    Side: Right lower extremity  Side: Left lower extremity  Stability: No instability observed          Stability: No instability observed          Skin &  Extremity Inspection: Skin color, temperature, and hair growth are WNL. No peripheral edema or cyanosis. No masses, redness, swelling, asymmetry, or associated skin lesions. No contractures.  Skin & Extremity Inspection: Skin color, temperature, and hair growth are WNL. No peripheral edema or cyanosis. No masses, redness, swelling, asymmetry, or associated skin lesions. No contractures.  Functional ROM: Unrestricted ROM                  Functional ROM: Unrestricted ROM                  Muscle Tone/Strength: Functionally intact. No obvious neuro-muscular anomalies detected.  Muscle Tone/Strength: Functionally intact. No obvious neuro-muscular anomalies detected.  Sensory (Neurological): Unimpaired  Sensory (Neurological): Unimpaired  Palpation: No palpable anomalies  Palpation: No palpable anomalies   Assessment  Primary Diagnosis & Pertinent Problem List: The primary encounter diagnosis was Primary osteoarthritis of both knees. Diagnoses of History of bilateral knee replacement, Myofascial pain, Lumbar spondylosis, Chronic pain syndrome, and Malignant neoplasm of ascending colon ([place]) were also pertinent to this visit.  Status Diagnosis  Controlled Controlled Controlled 1. Primary osteoarthritis of both knees   2. History of bilateral knee replacement   3. Myofascial pain   4. Lumbar spondylosis   5. Chronic pain syndrome   6. Malignant neoplasm of ascending colon [facility])     Problems updated and reviewed during this visit: Problem  Colon Cancer ([facility])   Added automatically from request for surgery [id number]    General Recommendations: The pain condition that the patient suffers from is best treated with a multidisciplinary approach that involves an increase in physical activity to prevent de-conditioning and worsening of the pain cycle, as well as psychological counseling (formal and/or informal) to address the co-morbid psychological affects of pain. Treatment will often involve judicious  use of pain medications and interventional procedures to decrease the pain, allowing the patient to participate in the physical activity that will ultimately produce long-lasting pain  reductions. The goal of the multidisciplinary approach is to return the patient to a higher level of overall function and to restore their ability to perform activities of daily living.  72 y.o. old female with a past medical history significant for bilateral knee replacements  with bilateral knee pain. Patient s/p bilateral knee replacements done in [place] many years ago, left one was done in 2008 and right one was done in 2007. She states that these knee replacements were done "incorrectly". She states that her knees are misaligned as a result of the surgery. Patient is status post bilateral genicular nerve block for bilateral knee painon 01/04/17 which was effective for her knee pain.  Since patient's last visit with me, she was diagnosed with T3N0 stage II colon cancer s/p right hemicolectomy on October 17, 2017.  Patient states that she is recovering well from surgery.  She states that her postoperative course was complicated by nausea and small bowel difficulty.  She states that overall her pain is well controlled on her current regimen of baclofen, gabapentin, and hydrocodone.  She states that she had to have her primary care physician fill her baclofen.  That is fine.  I will refill her hydrocodone as below for 3 months.  Patient instructed to continue gabapentin that she already has prescribed.  Does not need refill.  Plan of Care  Pharmacotherapy (Medications Ordered): Meds ordered this encounter  Medications  . HYDROcodone-acetaminophen (NORCO) 7.5-325 MG tablet    Sig: Take 1 tablet by mouth 3 (three) times daily as needed for moderate pain.    Dispense:  90 tablet    Refill:  0  . HYDROcodone-acetaminophen (NORCO) 7.5-325 MG tablet    Sig: Take 1 tablet by mouth 3 (three) times daily as needed for moderate  pain.    Dispense:  90 tablet    Refill:  0    Do not place this medication, or any other prescription from our practice, on "Automatic Refill". Patient may have prescription filled one day early if pharmacy is closed on scheduled refill date.  Marland Kitchen HYDROcodone-acetaminophen (NORCO) 7.5-325 MG tablet    Sig: Take 1 tablet by mouth 3 (three) times daily as needed for moderate pain.    Dispense:  90 tablet    Refill:  0    Do not place this medication, or any other prescription from our practice, on "Automatic Refill". Patient may have prescription filled one day early if pharmacy is closed on scheduled refill date.   Considering: -Bilateral genicular nerves block(first one 9/19 with >80% pain relief for approx 10-14 days), consider repeating with steroid or RFA -Intra-articular knee steroid injection -Lumbar medial branch blocks, L3-L5 bilaterally -bilateral cervical medial branch nerve blocks -Shoulder joint injection -SI joint injection  Time Note: Greater than 50% of the 25 minute(s) of face-to-face time spent with Ms. Anselmi, was spent in counseling/coordination of care regarding: the appropriate use of the pain scale, Ms. Addis's primary cause of pain, medication side effects, the opioid analgesic risks and possible complications, the appropriate use of her medications, realistic expectations, the medication agreement and the patient's responsibilities when it comes to controlled substances.  Provider-requested follow-up: Return in about 3 months (around 03/08/2018) for Medication Management.  Future Appointments  Date Time Provider Anon Raices  02/27/2018  1:45 PM Gillis Santa, MD [facility] None    Primary Care Physician: Patient, No Pcp Per Location: [facility] Note by: Gillis Santa, M.D Date: 12/06/2017; Time: 3:11 PM  There are  no Patient Instructions on file for this visit.

## 2017-12-06 NOTE — Progress Notes (Signed)
Nursing Pain Medication Assessment:  Safety precautions to be maintained throughout the outpatient stay will include: orient to surroundings, keep bed in low position, maintain call bell within reach at all times, provide assistance with transfer out of bed and ambulation.  Medication Inspection Compliance: Pill count conducted under aseptic conditions, in front of the patient. Neither the pills nor the bottle was removed from the patient's sight at any time. Once count was completed pills were immediately returned to the patient in their original bottle.  Medication: Hydrocodone/APAP Pill/Patch Count: 0 of 90 pills remain Pill/Patch Appearance: Markings consistent with prescribed medication Bottle Appearance: Standard pharmacy container. Clearly labeled. Filled Date: 07 / 09 / 2019 Last Medication intake:  Ran out of medicine more than 48 hours ago

## 2018-02-27 ENCOUNTER — Ambulatory Visit
Payer: Medicare HMO | Attending: Student in an Organized Health Care Education/Training Program | Admitting: Student in an Organized Health Care Education/Training Program

## 2018-02-27 ENCOUNTER — Other Ambulatory Visit: Payer: Self-pay

## 2018-02-27 ENCOUNTER — Encounter: Payer: Self-pay | Admitting: Student in an Organized Health Care Education/Training Program

## 2018-02-27 VITALS — BP 129/70 | HR 65 | Temp 98.7°F | Resp 20 | Ht 65.0 in | Wt 220.0 lb

## 2018-02-27 DIAGNOSIS — F411 Generalized anxiety disorder: Secondary | ICD-10-CM

## 2018-02-27 DIAGNOSIS — I10 Essential (primary) hypertension: Secondary | ICD-10-CM | POA: Diagnosis not present

## 2018-02-27 DIAGNOSIS — Z79899 Other long term (current) drug therapy: Secondary | ICD-10-CM | POA: Diagnosis not present

## 2018-02-27 DIAGNOSIS — M170 Bilateral primary osteoarthritis of knee: Secondary | ICD-10-CM | POA: Insufficient documentation

## 2018-02-27 DIAGNOSIS — M47816 Spondylosis without myelopathy or radiculopathy, lumbar region: Secondary | ICD-10-CM | POA: Diagnosis not present

## 2018-02-27 DIAGNOSIS — Z9889 Other specified postprocedural states: Secondary | ICD-10-CM | POA: Diagnosis not present

## 2018-02-27 DIAGNOSIS — Z87891 Personal history of nicotine dependence: Secondary | ICD-10-CM | POA: Insufficient documentation

## 2018-02-27 DIAGNOSIS — K219 Gastro-esophageal reflux disease without esophagitis: Secondary | ICD-10-CM | POA: Insufficient documentation

## 2018-02-27 DIAGNOSIS — E7800 Pure hypercholesterolemia, unspecified: Secondary | ICD-10-CM | POA: Insufficient documentation

## 2018-02-27 DIAGNOSIS — Z8249 Family history of ischemic heart disease and other diseases of the circulatory system: Secondary | ICD-10-CM | POA: Insufficient documentation

## 2018-02-27 DIAGNOSIS — Z888 Allergy status to other drugs, medicaments and biological substances status: Secondary | ICD-10-CM | POA: Diagnosis not present

## 2018-02-27 DIAGNOSIS — E785 Hyperlipidemia, unspecified: Secondary | ICD-10-CM | POA: Diagnosis not present

## 2018-02-27 DIAGNOSIS — M7918 Myalgia, other site: Secondary | ICD-10-CM | POA: Insufficient documentation

## 2018-02-27 DIAGNOSIS — G894 Chronic pain syndrome: Secondary | ICD-10-CM | POA: Insufficient documentation

## 2018-02-27 DIAGNOSIS — M549 Dorsalgia, unspecified: Secondary | ICD-10-CM | POA: Diagnosis present

## 2018-02-27 DIAGNOSIS — Z887 Allergy status to serum and vaccine status: Secondary | ICD-10-CM | POA: Diagnosis not present

## 2018-02-27 DIAGNOSIS — Z96653 Presence of artificial knee joint, bilateral: Secondary | ICD-10-CM | POA: Diagnosis not present

## 2018-02-27 MED ORDER — HYDROCODONE-ACETAMINOPHEN 7.5-325 MG PO TABS: 1.0000 | ORAL_TABLET | Freq: Three times a day (TID) | ORAL | 0 refills | Status: DC | PRN

## 2018-02-27 MED ORDER — BACLOFEN 10 MG PO TABS: 10.0000 mg | ORAL_TABLET | Freq: Three times a day (TID) | ORAL | 6 refills | Status: DC | PRN

## 2018-02-27 MED ORDER — HYDROCODONE-ACETAMINOPHEN 7.5-325 MG PO TABS: 1.0000 | ORAL_TABLET | Freq: Three times a day (TID) | ORAL | 0 refills | Status: AC | PRN

## 2018-02-27 MED ORDER — GABAPENTIN 300 MG PO CAPS: 300.0000 mg | ORAL_CAPSULE | Freq: Two times a day (BID) | ORAL | 6 refills | Status: DC

## 2018-02-27 NOTE — Progress Notes (Signed)
Nursing Pain Medication Assessment:  Safety precautions to be maintained throughout the outpatient stay will include: orient to surroundings, keep bed in low position, maintain call bell within reach at all times, provide assistance with transfer out of bed and ambulation.  Medication Inspection Compliance: Pill count conducted under aseptic conditions, in front of the patient. Neither the pills nor the bottle was removed from the patient's sight at any time. Once count was completed pills were immediately returned to the patient in their original bottle.  Medication: Hydrocodone/APAP Pill/Patch Count: 32 of 90 pills remain Pill/Patch Appearance: Markings consistent with prescribed medication Bottle Appearance: Standard pharmacy container. Clearly labeled. Filled Date: 10/23 / 2019 Last Medication intake:  Today

## 2018-02-27 NOTE — Progress Notes (Signed)
Patient's Name: Cindy Rose  MRN: [id number]  Referring Provider: No ref. provider found  DOB: [birth year]  PCP: Patient, No Pcp Per  DOS: 02/27/2018  Note by: Gillis Santa, MD  Service setting: Ambulatory outpatient  Specialty: Interventional Pain Management  Location: [facility] (AMB) Pain Management Facility    Patient type: Established   Primary Reason(s) for Visit: Encounter for prescription drug management. (Level of risk: moderate)  CC: Back Pain (lower)  HPI  Cindy Rose is a 72 y.o. year old, female patient, who comes today for a medication management evaluation. She has Primary osteoarthritis of both knees; Chronic pain syndrome; History of bilateral knee replacement; Anxiety state; Myofascial pain; Lumbar spondylosis; Arthritis; Bilateral hearing loss; Essential hypertension; GERD (gastroesophageal reflux disease); Hypercholesterolemia; Osteopenia; Spondylosis of lumbar region without myelopathy or radiculopathy; and Colon cancer ([place]) on their problem list. Her primarily concern today is the Back Pain (lower)  Pain Assessment: Location: Right, Left Knee Radiating:   Onset: More than a month ago Duration: Chronic pain Quality: Stabbing Severity: 5 /10 (subjective, self-reported pain score)  Note: Reported level is compatible with observation.                         When using our objective Pain Scale, levels between 6 and 10/10 are said to belong in an emergency room, as it progressively worsens from a 6/10, described as severely limiting, requiring emergency care not usually available at an outpatient pain management facility. At a 6/10 level, communication becomes difficult and requires great effort. Assistance to reach the emergency department may be required. Facial flushing and profuse sweating along with potentially dangerous increases in heart rate and blood pressure will be evident. Effect on ADL:   Timing: Intermittent Modifying factors: changing positions BP: 129/70  HR:  65  Cindy Rose was last scheduled for an appointment on 12/06/2017 for medication management. During today's appointment we reviewed Cindy Rose's chronic pain status, as well as her outpatient medication regimen.  The patient  reports that she does not use drugs. Her body mass index is 36.61 kg/m.  Further details on both, my assessment(s), as well as the proposed treatment plan, please see below.  Controlled Substance Pharmacotherapy Assessment REMS (Risk Evaluation and Mitigation Strategy)  Analgesic:Hydrocodone 7.5 mg 3 times daily as needed, quantity 90 MME/day:Approximately 20-24m/day. WLandis Martins RN  02/27/2018  1:25 PM  Sign at close encounter Nursing Pain Medication Assessment:  Safety precautions to be maintained throughout the outpatient stay will include: orient to surroundings, keep bed in low position, maintain call bell within reach at all times, provide assistance with transfer out of bed and ambulation.  Medication Inspection Compliance: Pill count conducted under aseptic conditions, in front of the patient. Neither the pills nor the bottle was removed from the patient's sight at any time. Once count was completed pills were immediately returned to the patient in their original bottle.  Medication: Hydrocodone/APAP Pill/Patch Count: 32 of 90 pills remain Pill/Patch Appearance: Markings consistent with prescribed medication Bottle Appearance: Standard pharmacy container. Clearly labeled. Filled Date: 10/23 / 2019 Last Medication intake:  Today   Pharmacokinetics: Liberation and absorption (onset of action): WNL Distribution (time to peak effect): WNL Metabolism and excretion (duration of action): WNL         Pharmacodynamics: Desired effects: Analgesia: Cindy Rose >50% benefit. Functional ability: Patient reports that medication allows her to accomplish basic ADLs Clinically meaningful improvement in function (CMIF): Sustained CMIF goals  met Perceived  effectiveness: Described as relatively effective, allowing for increase in activities of daily living (ADL) Undesirable effects: Side-effects or Adverse reactions: None reported Monitoring: [place] PMP: Online review of the past 20-monthperiod conducted. Compliant with practice rules and regulations Last UDS on record: Summary  Date Value Ref Range Status  12/06/2016 FINAL  Final    Comment:    ==================================================================== [facility] COMP DRUG ANALYSIS,UR ==================================================================== Test                             Result       Flag       Units Drug Present and Declared for Prescription Verification   Norhydrocodone                 625          EXPECTED   ng/mg creat    Norhydrocodone is an expected metabolite of hydrocodone.   Gabapentin                     PRESENT      EXPECTED   Acetaminophen                  PRESENT      EXPECTED Drug Present not Declared for Prescription Verification   Baclofen                       PRESENT      UNEXPECTED Drug Absent but Declared for Prescription Verification   Hydrocodone                    Not Detected UNEXPECTED ng/mg creat    Hydrocodone is almost always present in patients taking this drug    consistently. Absence of hydrocodone could be due to lapse of    time since the last dose or unusual pharmacokinetics (rapid    metabolism).   Carisoprodol                   Not Detected UNEXPECTED   Naproxen                       Not Detected UNEXPECTED ==================================================================== Test                      Result    Flag   Units      Ref Range   Creatinine              32               mg/dL      >=20 ==================================================================== Declared Medications:  The flagging and interpretation on this report are based on the  following declared medications.  Unexpected results may arise  from  inaccuracies in the declared medications.  **Note: The testing scope of this panel includes these medications:  Carisoprodol  Gabapentin  Hydrocodone (Hydrocodone-Acetaminophen)  Naproxen  **Note: The testing scope of this panel does not include small to  moderate amounts of these reported medications:  Acetaminophen (Hydrocodone-Acetaminophen)  **Note: The testing scope of this panel does not include following  reported medications:  Calcium (Calcium/Magnesium)  Celecoxib  Hydrochlorothiazide (Valsartan-Hydrochlorothizide)  Magnesium (Calcium/Magnesium)  Omeprazole  Valsartan (Valsartan-Hydrochlorothizide)  Zinc ==================================================================== For clinical consultation, please call [phone] ====================================================================    UDS interpretation: Compliant         We will repeat today Medication Assessment  Form: Reviewed. Patient indicates being compliant with therapy Treatment compliance: Compliant Risk Assessment Profile: Aberrant behavior: See prior evaluations. None observed or detected today Comorbid factors increasing risk of overdose: See prior notes. No additional risks detected today Opioid risk tool (ORT) (Total Score): 0 Personal History of Substance Abuse (SUD-Substance use disorder):  Alcohol: Negative  Illegal Drugs: Negative  Rx Drugs: Negative  ORT Risk Level calculation: Low Risk Risk of substance use disorder (SUD): Low Opioid Risk Tool - 02/27/18 1323      Family History of Substance Abuse   Alcohol  Negative    Illegal Drugs  Negative    Rx Drugs  Negative      Personal History of Substance Abuse   Alcohol  Negative    Illegal Drugs  Negative    Rx Drugs  Negative      Age   Age between 14-45 years   No      History of Preadolescent Sexual Abuse   History of Preadolescent Sexual Abuse  Negative or Female      Psychological Disease   Psychological Disease   Negative    Depression  Negative      Total Score   Opioid Risk Tool Scoring  0    Opioid Risk Interpretation  Low Risk      ORT Scoring interpretation table:  Score <3 = Low Risk for SUD  Score between 4-7 = Moderate Risk for SUD  Score >8 = High Risk for Opioid Abuse   Risk Mitigation Strategies:  Patient Counseling: Covered Patient-Prescriber Agreement (PPA): Present and active  Notification to other healthcare providers: Done  Pharmacologic Plan: No change in therapy, at this time.             Recent Diagnostic Imaging Results  DG C-Arm 1-60 Min-No Report Fluoroscopy was utilized by the requesting physician.  No radiographic  interpretation.   Complexity Note: Imaging results reviewed. Results shared with Ms. Shawn, using Layman's terms.                         Meds   Current Outpatient Medications:  .  atorvastatin (LIPITOR) 40 MG tablet, Take 40 mg by mouth., Disp: , Rfl:  .  baclofen (LIORESAL) 10 MG tablet, Take 1 tablet (10 mg total) by mouth 3 (three) times daily as needed for muscle spasms., Disp: 90 tablet, Rfl: 6 .  CALCIUM-MAGNESIUM-ZINC PO, Take by mouth., Disp: , Rfl:  .  gabapentin (NEURONTIN) 300 MG capsule, Take 1 capsule (300 mg total) by mouth 2 (two) times daily., Disp: 60 capsule, Rfl: 6 .  [START ON 03/09/2018] HYDROcodone-acetaminophen (NORCO) 7.5-325 MG tablet, Take 1 tablet by mouth 3 (three) times daily as needed for moderate pain., Disp: 90 tablet, Rfl: 0 .  losartan (COZAAR) 50 MG tablet, Take by mouth., Disp: , Rfl:  .  naproxen (NAPROSYN) 500 MG tablet, Take by mouth., Disp: , Rfl:  .  omeprazole (PRILOSEC) 40 MG capsule, TAKE ONE CAPSULE BY MOUTH ONCE DAILY, Disp: , Rfl:  .  valsartan-hydrochlorothiazide (DIOVAN-HCT) 160-12.5 MG tablet, TAKE ONE TABLET BY MOUTH IN THE EVENING, Disp: , Rfl:  .  omeprazole (PRILOSEC) 40 MG capsule, Take 40 mg by mouth., Disp: , Rfl:  .  valsartan-hydrochlorothiazide (DIOVAN-HCT) 160-12.5 MG tablet, Take by  mouth., Disp: , Rfl:   ROS  Constitutional: Denies any fever or chills Gastrointestinal: No reported hemesis, hematochezia, vomiting, or acute GI distress Musculoskeletal: Denies any acute onset joint swelling,  redness, loss of ROM, or weakness Neurological: No reported episodes of acute onset apraxia, aphasia, dysarthria, agnosia, amnesia, paralysis, loss of coordination, or loss of consciousness  Allergies  Ms. Cohoon is allergic to tape and zoster vaccine live.  PFSH  Drug: Ms. Willbanks  reports that she does not use drugs. Alcohol:  reports that she does not drink alcohol. Tobacco:  reports that she quit smoking about 11 years ago. Her smoking use included cigarettes. She has never used smokeless tobacco. Medical:  has a past medical history of Allergy, Anxiety, Arthritis, Cancer ([place]) (09/2017), Hyperlipidemia, and Hypertension. Surgical: Ms. Spira  has a past surgical history that includes Joint replacement; Abdominal hysterectomy; and Wrist surgery. Family: family history includes Cancer in her father; Heart disease in her mother.  Constitutional Exam  General appearance: Well nourished, well developed, and well hydrated. In no apparent acute distress Vitals:   02/27/18 1316  BP: 129/70  Pulse: 65  Resp: 20  Temp: 98.7 F (37.1 C)  TempSrc: Oral  SpO2: 100%  Weight: 220 lb (99.8 kg)  Height: 5' 5"  (1.651 m)   BMI Assessment: Estimated body mass index is 36.61 kg/m as calculated from the following:   Height as of this encounter: 5' 5"  (1.651 m).   Weight as of this encounter: 220 lb (99.8 kg).  BMI interpretation table: BMI level Category Range association with higher incidence of chronic pain  <18 kg/m2 Underweight   18.5-24.9 kg/m2 Ideal body weight   25-29.9 kg/m2 Overweight Increased incidence by 20%  30-34.9 kg/m2 Obese (Class I) Increased incidence by 68%  35-39.9 kg/m2 Severe obesity (Class II) Increased incidence by 136%  >40 kg/m2 Extreme obesity  (Class III) Increased incidence by 254%   Patient's current BMI Ideal Body weight  Body mass index is 36.61 kg/m. Ideal body weight: 57 kg (125 lb 10.6 oz) Adjusted ideal body weight: 74.1 kg (163 lb 6.4 oz)   BMI Readings from Last 4 Encounters:  02/27/18 36.61 kg/m  12/06/17 36.56 kg/m  08/08/17 37.76 kg/m  05/16/17 36.61 kg/m   Wt Readings from Last 4 Encounters:  02/27/18 220 lb (99.8 kg)  12/06/17 213 lb (96.6 kg)  08/08/17 220 lb (99.8 kg)  05/16/17 220 lb (99.8 kg)  Psych/Mental status: Alert, oriented x 3 (person, place, & time)       Eyes: PERLA Respiratory: No evidence of acute respiratory distress  Cervical Spine Area Exam  Skin & Axial Inspection: No masses, redness, edema, swelling, or associated skin lesions Alignment: Symmetrical Functional ROM: Unrestricted ROM      Stability: No instability detected Muscle Tone/Strength: Functionally intact. No obvious neuro-muscular anomalies detected. Sensory (Neurological): Unimpaired Palpation: No palpable anomalies              Upper Extremity (UE) Exam    Side: Right upper extremity  Side: Left upper extremity  Skin & Extremity Inspection: Skin color, temperature, and hair growth are WNL. No peripheral edema or cyanosis. No masses, redness, swelling, asymmetry, or associated skin lesions. No contractures.  Skin & Extremity Inspection: Skin color, temperature, and hair growth are WNL. No peripheral edema or cyanosis. No masses, redness, swelling, asymmetry, or associated skin lesions. No contractures.  Functional ROM: Unrestricted ROM          Functional ROM: Unrestricted ROM          Muscle Tone/Strength: Functionally intact. No obvious neuro-muscular anomalies detected.  Muscle Tone/Strength: Functionally intact. No obvious neuro-muscular anomalies detected.  Sensory (Neurological): Unimpaired  Sensory (Neurological): Unimpaired          Palpation: No palpable anomalies              Palpation: No palpable  anomalies              Provocative Test(s):  Phalen's test: deferred Tinel's test: deferred Apley's scratch test (touch opposite shoulder):  Action 1 (Across chest): deferred Action 2 (Overhead): deferred Action 3 (LB reach): deferred   Provocative Test(s):  Phalen's test: deferred Tinel's test: deferred Apley's scratch test (touch opposite shoulder):  Action 1 (Across chest): deferred Action 2 (Overhead): deferred Action 3 (LB reach): deferred    Thoracic Spine Area Exam  Skin & Axial Inspection: No masses, redness, or swelling Alignment: Symmetrical Functional ROM: Unrestricted ROM Stability: No instability detected Muscle Tone/Strength: Functionally intact. No obvious neuro-muscular anomalies detected. Sensory (Neurological): Unimpaired Muscle strength & Tone: No palpable anomalies  Lumbar Spine Area Exam  Skin & Axial Inspection: No masses, redness, or swelling Alignment: Symmetrical Functional ROM: Unrestricted ROM       Stability: No instability detected Muscle Tone/Strength: Functionally intact. No obvious neuro-muscular anomalies detected. Sensory (Neurological): Unimpaired Palpation: No palpable anomalies       Provocative Tests: Hyperextension/rotation test: deferred today       Lumbar quadrant test (Kemp's test): deferred today       Lateral bending test: deferred today       Patrick's Maneuver: deferred today                   FABER test: deferred today                   S-I anterior distraction/compression test: deferred today         S-I lateral compression test: deferred today         S-I Thigh-thrust test: deferred today         S-I Gaenslen's test: deferred today          Gait & Posture Assessment  Ambulation: Unassisted Gait: Relatively normal for age and body habitus Posture: WNL   Lower Extremity Exam    Side: Right lower extremity  Side: Left lower extremity  Stability: No instability observed          Stability: No instability observed           Skin & Extremity Inspection: Skin color, temperature, and hair growth are WNL. No peripheral edema or cyanosis. No masses, redness, swelling, asymmetry, or associated skin lesions. No contractures.  Skin & Extremity Inspection: Skin color, temperature, and hair growth are WNL. No peripheral edema or cyanosis. No masses, redness, swelling, asymmetry, or associated skin lesions. No contractures.  Functional ROM: Unrestricted ROM                  Functional ROM: Unrestricted ROM                  Muscle Tone/Strength: Functionally intact. No obvious neuro-muscular anomalies detected.  Muscle Tone/Strength: Functionally intact. No obvious neuro-muscular anomalies detected.  Sensory (Neurological): Arthropathic arthralgia  Sensory (Neurological): Arthropathic arthralgia  Palpation: No palpable anomalies  Palpation: No palpable anomalies   Assessment  Primary Diagnosis & Pertinent Problem List: The primary encounter diagnosis was Chronic pain syndrome. Diagnoses of History of bilateral knee replacement, Primary osteoarthritis of both knees, Myofascial pain, and Anxiety state were also pertinent to this visit.  Status Diagnosis  Controlled Controlled Controlled 1. Chronic pain syndrome   2. History  of bilateral knee replacement   3. Primary osteoarthritis of both knees   4. Myofascial pain   5. Anxiety state      General Recommendations: The pain condition that the patient suffers from is best treated with a multidisciplinary approach that involves an increase in physical activity to prevent de-conditioning and worsening of the pain cycle, as well as psychological counseling (formal and/or informal) to address the co-morbid psychological affects of pain. Treatment will often involve judicious use of pain medications and interventional procedures to decrease the pain, allowing the patient to participate in the physical activity that will ultimately produce long-lasting pain reductions. The goal of the  multidisciplinary approach is to return the patient to a higher level of overall function and to restore their ability to perform activities of daily living.  72 y.o. old female with a past medical history significant for bilateral knee replacements  with bilateral knee pain. Patient s/p bilateral knee replacements done in [place] many years ago, left one was done in 2008 and right one was done in 2007. She states that these knee replacements were done "incorrectly". She states that her knees are misaligned as a result of the surgery. Patient is status post bilateral genicular nerve block for bilateral knee painon 9/19/18which was effective for her knee pain. Of note, she was diagnosed with T3N0 stage II colon cancer and is s/p right hemicolectomy on October 17, 2017.  Patient states that she is recovering well from surgery.  She states that overall her pain is well controlled on her current regimen of baclofen, gabapentin, and hydrocodone  Refill medications as below and repeat annual urine drug screen for medication compliance and monitoring.  Plan of Care  Pharmacotherapy (Medications Ordered): Meds ordered this encounter  Medications  . gabapentin (NEURONTIN) 300 MG capsule    Sig: Take 1 capsule (300 mg total) by mouth 2 (two) times daily.    Dispense:  60 capsule    Refill:  6  . baclofen (LIORESAL) 10 MG tablet    Sig: Take 1 tablet (10 mg total) by mouth 3 (three) times daily as needed for muscle spasms.    Dispense:  90 tablet    Refill:  6    Do not place this medication, or any other prescription from our practice, on "Automatic Refill". Patient may have prescription filled one day early if pharmacy is closed on scheduled refill date.  Marland Kitchen DISCONTD: HYDROcodone-acetaminophen (NORCO) 7.5-325 MG tablet    Sig: Take 1 tablet by mouth 3 (three) times daily as needed for moderate pain.    Dispense:  90 tablet    Refill:  0    Do not place this medication, or any other prescription from  our practice, on "Automatic Refill". Patient may have prescription filled one day early if pharmacy is closed on scheduled refill date.  To fill on or after 03/09/2018, 04/07/2018, 05/07/2018  . DISCONTD: HYDROcodone-acetaminophen (NORCO) 7.5-325 MG tablet    Sig: Take 1 tablet by mouth 3 (three) times daily as needed for moderate pain.    Dispense:  90 tablet    Refill:  0    Do not place this medication, or any other prescription from our practice, on "Automatic Refill". Patient may have prescription filled one day early if pharmacy is closed on scheduled refill date.  To fill on or after 03/09/2018, 04/07/2018, 05/07/2018  . HYDROcodone-acetaminophen (NORCO) 7.5-325 MG tablet    Sig: Take 1 tablet by mouth 3 (three) times daily as needed  for moderate pain.    Dispense:  90 tablet    Refill:  0    Do not place this medication, or any other prescription from our practice, on "Automatic Refill". Patient may have prescription filled one day early if pharmacy is closed on scheduled refill date.  To fill on or after 03/09/2018, 04/07/2018, 05/07/2018   Lab-work, procedure(s), and/or referral(s): Orders Placed This Encounter  Procedures  . [employer]ox[employer] Select 13 (MW), Urine   Time Note: Greater than 50% of the 25 minute(s) of face-to-face time spent with Ms. Julio, was spent in counseling/coordination of care regarding: Ms. Vanderweele's primary cause of pain, the treatment plan, medication side effects, the opioid analgesic risks and possible complications, the appropriate use of her medications, realistic expectations, the goals of pain management (increased in functionality), the medication agreement and the patient's responsibilities when it comes to controlled substances.  Provider-requested follow-up: No follow-ups on file.  Future Appointments  Date Time Provider [place]  05/29/2018  1:45 PM Gillis Santa, MD [facility] None    Primary Care Physician: Patient, No Pcp  Per Location: [facility] Note by: Gillis Santa, M.D Date: 02/27/2018; Time: 3:09 PM  Patient Instructions  You have been given 3 scripts for Hydrocodone today.  You also have gabapentin and baclofen to be picked up at the pharmacy.  You provided a UDS today.

## 2018-02-27 NOTE — Patient Instructions (Addendum)
You have been given 3 scripts for Hydrocodone today.  You also have gabapentin and baclofen to be picked up at the pharmacy.  You provided a UDS today.

## 2018-03-03 LAB — TOXASSURE SELECT 13 (MW), URINE

## 2018-05-15 ENCOUNTER — Other Ambulatory Visit: Payer: Self-pay

## 2018-05-15 ENCOUNTER — Encounter: Payer: Self-pay | Admitting: Student in an Organized Health Care Education/Training Program

## 2018-05-15 ENCOUNTER — Ambulatory Visit
Payer: Medicare HMO | Attending: Student in an Organized Health Care Education/Training Program | Admitting: Student in an Organized Health Care Education/Training Program

## 2018-05-15 VITALS — BP 135/82 | HR 65 | Temp 98.1°F | Resp 18 | Ht 64.0 in | Wt 220.0 lb

## 2018-05-15 DIAGNOSIS — M47816 Spondylosis without myelopathy or radiculopathy, lumbar region: Secondary | ICD-10-CM | POA: Diagnosis present

## 2018-05-15 DIAGNOSIS — G894 Chronic pain syndrome: Secondary | ICD-10-CM | POA: Diagnosis not present

## 2018-05-15 DIAGNOSIS — M7918 Myalgia, other site: Secondary | ICD-10-CM | POA: Insufficient documentation

## 2018-05-15 DIAGNOSIS — Z96653 Presence of artificial knee joint, bilateral: Secondary | ICD-10-CM | POA: Diagnosis present

## 2018-05-15 DIAGNOSIS — M170 Bilateral primary osteoarthritis of knee: Secondary | ICD-10-CM | POA: Diagnosis present

## 2018-05-15 MED ORDER — HYDROCODONE-ACETAMINOPHEN 7.5-325 MG PO TABS: 1.0000 | ORAL_TABLET | Freq: Three times a day (TID) | ORAL | 0 refills | Status: DC | PRN

## 2018-05-15 MED ORDER — HYDROCODONE-ACETAMINOPHEN 7.5-325 MG PO TABS: 1.0000 | ORAL_TABLET | Freq: Three times a day (TID) | ORAL | 0 refills | Status: AC | PRN

## 2018-05-15 NOTE — Progress Notes (Signed)
Nursing Pain Medication Assessment:  Safety precautions to be maintained throughout the outpatient stay will include: orient to surroundings, keep bed in low position, maintain call bell within reach at all times, provide assistance with transfer out of bed and ambulation.  Medication Inspection Compliance: Pill count conducted under aseptic conditions, in front of the patient. Neither the pills nor the bottle was removed from the patient's sight at any time. Once count was completed pills were immediately returned to the patient in their original bottle.  Medication: Hydrocodone/APAP Pill/Patch Count: 73 of 90 pills remain Pill/Patch Appearance: Markings consistent with prescribed medication Bottle Appearance: Standard pharmacy container. Clearly labeled. Filled Date: 1 / 22 / 2020 Last Medication intake:  Today

## 2018-05-15 NOTE — Progress Notes (Signed)
Patient's Name: Cindy Rose  MRN: [id number]  Referring Provider: No ref. provider found  DOB: 12-Feb-1946  PCP: Patient, No Pcp Per  DOS: 05/15/2018  Note by: Gillis Santa, MD  Service setting: Ambulatory outpatient  Specialty: Interventional Pain Management  Location: [facility] (AMB) Pain Management Facility    Patient type: Established   Primary Reason(s) for Visit: Encounter for prescription drug management. (Level of risk: moderate)  CC: Knee Pain and Back Pain (low)  HPI  Cindy Rose is a 73 y.o. year old, female patient, who comes today for a follow-up evaluation. She has Primary osteoarthritis of both knees; Chronic pain syndrome; History of bilateral knee replacement; Anxiety state; Myofascial pain; Lumbar spondylosis; Arthritis; Bilateral hearing loss; Essential hypertension; GERD (gastroesophageal reflux disease); Hypercholesterolemia; Osteopenia; Spondylosis of lumbar region without myelopathy or radiculopathy; and Colon cancer ([place]) on their problem list. Cindy Rose was last seen on 02/27/2018. Her primarily concern today is the Knee Pain and Back Pain (low)  Cindy Rose last encounter's weight was 220 lb (99.8 kg), with a BMI of 37.76 kg/m. We have pointed out to the patient that as long as her BMI remains above 30, this will adversely affect her pain.  Estimated body mass index is 37.76 kg/m as calculated from the following:   Height as of this encounter: 5' 4"  (1.626 m).   Weight as of this encounter: 220 lb (99.8 kg).  Her ideal body weight should be: Ideal body weight: 54.7 kg (120 lb 9.5 oz) Adjusted ideal body weight: 72.7 kg (160 lb 5.7 oz)  Pain Assessment: Location: Right, Left Knee Radiating: radiates into both shins Onset: More than a month ago Duration: Chronic pain Quality: Aching, Sharp Severity: 5 /10 (subjective, self-reported pain score)  Note: Reported level is inconsistent with clinical observations.                         When using our objective  Pain Scale, levels between 6 and 10/10 are said to belong in an emergency room, as it progressively worsens from a 6/10, described as severely limiting, requiring emergency care not usually available at an outpatient pain management facility. At a 6/10 level, communication becomes difficult and requires great effort. Assistance to reach the emergency department may be required. Facial flushing and profuse sweating along with potentially dangerous increases in heart rate and blood pressure will be evident. Effect on ADL: Limits activities Timing: Constant Modifying factors: rest change positions BP: 135/82  HR: 65  Further details on both, my assessment(s), as well as the proposed treatment plan, please see below.  Controlled Substance Pharmacotherapy Assessment REMS (Risk Evaluation and Mitigation Strategy)   05/09/2018  1   03/09/2018  Hydrocodone-Acetamin 7.5-325  90.00 30 Bi Lat   [id number]   [place] (7248)   0  22.50 MME  Medicare   [place]    Cindy Shorter, RN  05/15/2018  1:10 PM  Signed Nursing Pain Medication Assessment:  Safety precautions to be maintained throughout the outpatient stay will include: orient to surroundings, keep bed in low position, maintain call bell within reach at all times, provide assistance with transfer out of bed and ambulation.  Medication Inspection Compliance: Pill count conducted under aseptic conditions, in front of the patient. Neither the pills nor the bottle was removed from the patient's sight at any time. Once count was completed pills were immediately returned to the patient in their original bottle.  Medication: Hydrocodone/APAP Pill/Patch Count: 73 of 90 pills  remain Pill/Patch Appearance: Markings consistent with prescribed medication Bottle Appearance: Standard pharmacy container. Clearly labeled. Filled Date: 1 / 22 / 2020 Last Medication intake:  Today   Pharmacokinetics: Liberation and absorption (onset of action): WNL Distribution (time to peak  effect): WNL Metabolism and excretion (duration of action): WNL         Pharmacodynamics: Desired effects: Analgesia: Cindy Rose reports >50% benefit. Functional ability: Patient reports that medication allows her to accomplish basic ADLs Clinically meaningful improvement in function (CMIF): Sustained CMIF goals met Perceived effectiveness: Described as relatively effective, allowing for increase in activities of daily living (ADL) Undesirable effects: Side-effects or Adverse reactions: None reported Monitoring: [place] PMP: Online review of the past 35-monthperiod conducted. Compliant with practice rules and regulations Last UDS on record: Summary  Date Value Ref Range Status  02/27/2018 FINAL  Final    Comment:    ==================================================================== TOXASSURE SELECT 13 (MW) ==================================================================== Test                             Result       Flag       Units Drug Present and Declared for Prescription Verification   Norhydrocodone                 603          EXPECTED   ng/mg creat    Norhydrocodone is an expected metabolite of hydrocodone. Drug Absent but Declared for Prescription Verification   Hydrocodone                    Not Detected UNEXPECTED ng/mg creat    Hydrocodone is almost always present in patients taking this drug    consistently. Absence of hydrocodone could be due to lapse of    time since the last dose or unusual pharmacokinetics (rapid    metabolism). ==================================================================== Test                      Result    Flag   Units      Ref Range   Creatinine              32               mg/dL      >=20 ==================================================================== Declared Medications:  The flagging and interpretation on this report are based on the  following declared medications.  Unexpected results may arise from  inaccuracies in the  declared medications.  **Note: The testing scope of this panel includes these medications:  Hydrocodone (Hydrocodone-Acetaminophen)  **Note: The testing scope of this panel does not include following  reported medications:  Acetaminophen (Hydrocodone-Acetaminophen)  Atorvastatin  Baclofen  Calcium  Gabapentin  Hydrochlorothiazide (Valsartan-Hydrochlorothizide)  Losartan (Losartan Potassium)  Magnesium  Naproxen  Omeprazole  Valsartan (Valsartan-Hydrochlorothizide)  Zinc ==================================================================== For clinical consultation, please call ([phone] ====================================================================    UDS interpretation: Compliant          Medication Assessment Form: Reviewed. Patient indicates being compliant with therapy Treatment compliance: Compliant Risk Assessment Profile: Aberrant behavior: See prior evaluations. None observed or detected today Comorbid factors increasing risk of overdose: See prior notes. No additional risks detected today Opioid risk tool (ORT) (Total Score): 6 Personal History of Substance Abuse (SUD-Substance use disorder):  Alcohol: Negative  Illegal Drugs: Negative  Rx Drugs: Negative  ORT Risk Level calculation: Moderate Risk Risk of substance use disorder (SUD): Low  Opioid Risk Tool - 05/15/18 1308      Family History of Substance Abuse   Alcohol  Positive Female    Illegal Drugs  Positive Female    Rx Drugs  Negative      Personal History of Substance Abuse   Alcohol  Negative    Illegal Drugs  Negative    Rx Drugs  Negative      Age   Age between 54-45 years   No      History of Preadolescent Sexual Abuse   History of Preadolescent Sexual Abuse  Negative or Female      Psychological Disease   Psychological Disease  Negative    Depression  Negative      Total Score   Opioid Risk Tool Scoring  6    Opioid Risk Interpretation  Moderate Risk      ORT Scoring  interpretation table:  Score <3 = Low Risk for SUD  Score between 4-7 = Moderate Risk for SUD  Score >8 = High Risk for Opioid Abuse   Risk Mitigation Strategies:  Patient Counseling: Covered Patient-Prescriber Agreement (PPA): Present and active  Notification to other healthcare providers: Done  Pharmacologic Plan: No change in therapy, at this time.              Recent Diagnostic Imaging Results  DG C-Arm 1-60 Min-No Report Fluoroscopy was utilized by the requesting physician.  No radiographic  interpretation.   Complexity Note: Imaging results reviewed. Results shared with Ms. Estell, using Layman's terms.                Meds   Current Outpatient Medications:  .  atorvastatin (LIPITOR) 40 MG tablet, Take 40 mg by mouth., Disp: , Rfl:  .  baclofen (LIORESAL) 10 MG tablet, Take 1 tablet (10 mg total) by mouth 3 (three) times daily as needed for muscle spasms., Disp: 90 tablet, Rfl: 6 .  CALCIUM-MAGNESIUM-ZINC PO, Take by mouth., Disp: , Rfl:  .  gabapentin (NEURONTIN) 300 MG capsule, Take 1 capsule (300 mg total) by mouth 2 (two) times daily., Disp: 60 capsule, Rfl: 6 .  hydrochlorothiazide (HYDRODIURIL) 25 MG tablet, Take 25 mg by mouth daily., Disp: , Rfl:  .  [START ON 06/08/2018] HYDROcodone-acetaminophen (NORCO) 7.5-325 MG tablet, Take 1 tablet by mouth every 8 (eight) hours as needed for up to 30 days for moderate pain., Disp: 90 tablet, Rfl: 0 .  losartan (COZAAR) 50 MG tablet, Take by mouth., Disp: , Rfl:  .  naproxen (NAPROSYN) 500 MG tablet, Take by mouth., Disp: , Rfl:  .  omeprazole (PRILOSEC) 40 MG capsule, TAKE ONE CAPSULE BY MOUTH ONCE DAILY, Disp: , Rfl:  .  omeprazole (PRILOSEC) 40 MG capsule, Take 40 mg by mouth., Disp: , Rfl:  .  valsartan-hydrochlorothiazide (DIOVAN-HCT) 160-12.5 MG tablet, Take by mouth., Disp: , Rfl:  .  [START ON 07/08/2018] HYDROcodone-acetaminophen (NORCO) 7.5-325 MG tablet, Take 1 tablet by mouth every 8 (eight) hours as needed for up to  30 days for moderate pain., Disp: 90 tablet, Rfl: 0 .  valsartan-hydrochlorothiazide (DIOVAN-HCT) 160-12.5 MG tablet, TAKE ONE TABLET BY MOUTH IN THE EVENING, Disp: , Rfl:   ROS  Constitutional: Denies any fever or chills Gastrointestinal: No reported hemesis, hematochezia, vomiting, or acute GI distress Musculoskeletal: Denies any acute onset joint swelling, redness, loss of ROM, or weakness Neurological: No reported episodes of acute onset apraxia, aphasia, dysarthria, agnosia, amnesia, paralysis, loss of coordination, or loss of consciousness  Allergies  Ms. Pasch is allergic to tape and zoster vaccine live.  PFSH  Drug: Ms. Probasco  reports no history of drug use. Alcohol:  reports no history of alcohol use. Tobacco:  reports that she quit smoking about 11 years ago. Her smoking use included cigarettes. She has never used smokeless tobacco. Medical:  has a past medical history of Allergy, Anxiety, Arthritis, Cancer ([place]) (09/2017), Hyperlipidemia, and Hypertension. Surgical: Ms. Breeding  has a past surgical history that includes Joint replacement; Abdominal hysterectomy; and Wrist surgery. Family: family history includes Cancer in her father; Heart disease in her mother.  Constitutional Exam  General appearance: Well nourished, well developed, and well hydrated. In no apparent acute distress Vitals:   05/15/18 1259  BP: 135/82  Pulse: 65  Resp: 18  Temp: 98.1 F (36.7 C)  SpO2: 100%  Weight: 220 lb (99.8 kg)  Height: 5' 4"  (1.626 m)   BMI Assessment: Estimated body mass index is 37.76 kg/m as calculated from the following:   Height as of this encounter: 5' 4"  (1.626 m).   Weight as of this encounter: 220 lb (99.8 kg).  BMI interpretation table: BMI level Category Range association with higher incidence of chronic pain  <18 kg/m2 Underweight   18.5-24.9 kg/m2 Ideal body weight   25-29.9 kg/m2 Overweight Increased incidence by 20%  30-34.9 kg/m2 Obese (Class I)  Increased incidence by 68%  35-39.9 kg/m2 Severe obesity (Class II) Increased incidence by 136%  >40 kg/m2 Extreme obesity (Class III) Increased incidence by 254%   Patient's current BMI Ideal Body weight  Body mass index is 37.76 kg/m. Ideal body weight: 54.7 kg (120 lb 9.5 oz) Adjusted ideal body weight: 72.7 kg (160 lb 5.7 oz)   BMI Readings from Last 4 Encounters:  05/15/18 37.76 kg/m  02/27/18 36.61 kg/m  12/06/17 36.56 kg/m  08/08/17 37.76 kg/m   Wt Readings from Last 4 Encounters:  05/15/18 220 lb (99.8 kg)  02/27/18 220 lb (99.8 kg)  12/06/17 213 lb (96.6 kg)  08/08/17 220 lb (99.8 kg)  Psych/Mental status: Alert, oriented x 3 (person, place, & time)       Eyes: PERLA Respiratory: No evidence of acute respiratory distress  Cervical Spine Area Exam  Skin & Axial Inspection: No masses, redness, edema, swelling, or associated skin lesions Alignment: Symmetrical Functional ROM: Unrestricted ROM      Stability: No instability detected Muscle Tone/Strength: Functionally intact. No obvious neuro-muscular anomalies detected. Sensory (Neurological): Unimpaired Palpation: No palpable anomalies              Upper Extremity (UE) Exam    Side: Right upper extremity  Side: Left upper extremity  Skin & Extremity Inspection: Skin color, temperature, and hair growth are WNL. No peripheral edema or cyanosis. No masses, redness, swelling, asymmetry, or associated skin lesions. No contractures.  Skin & Extremity Inspection: Skin color, temperature, and hair growth are WNL. No peripheral edema or cyanosis. No masses, redness, swelling, asymmetry, or associated skin lesions. No contractures.  Functional ROM: Unrestricted ROM          Functional ROM: Unrestricted ROM          Muscle Tone/Strength: Functionally intact. No obvious neuro-muscular anomalies detected.  Muscle Tone/Strength: Functionally intact. No obvious neuro-muscular anomalies detected.  Sensory (Neurological): Unimpaired           Sensory (Neurological): Unimpaired          Palpation: No palpable anomalies  Palpation: No palpable anomalies              Provocative Test(s):  Phalen's test: deferred Tinel's test: deferred Apley's scratch test (touch opposite shoulder):  Action 1 (Across chest): deferred Action 2 (Overhead): deferred Action 3 (LB reach): deferred   Provocative Test(s):  Phalen's test: deferred Tinel's test: deferred Apley's scratch test (touch opposite shoulder):  Action 1 (Across chest): deferred Action 2 (Overhead): deferred Action 3 (LB reach): deferred    Thoracic Spine Area Exam  Skin & Axial Inspection: No masses, redness, or swelling Alignment: Symmetrical Functional ROM: Unrestricted ROM Stability: No instability detected Muscle Tone/Strength: Functionally intact. No obvious neuro-muscular anomalies detected. Sensory (Neurological): Unimpaired Muscle strength & Tone: No palpable anomalies  Lumbar Spine Area Exam  Skin & Axial Inspection: No masses, redness, or swelling Alignment: Symmetrical Functional ROM: Decreased ROM       Stability: No instability detected Muscle Tone/Strength: Functionally intact. No obvious neuro-muscular anomalies detected. Sensory (Neurological): Musculoskeletal pain pattern Palpation: No palpable anomalies       Provocative Tests: Hyperextension/rotation test: deferred today       Lumbar quadrant test (Kemp's test): deferred today       Lateral bending test: deferred today       Patrick's Maneuver: deferred today                   FABER* test: deferred today                   S-I anterior distraction/compression test: deferred today         S-I lateral compression test: deferred today         S-I Thigh-thrust test: deferred today         S-I Gaenslen's test: deferred today         *(Flexion, ABduction and External Rotation)  Gait & Posture Assessment  Ambulation: Patient ambulates using a cane Gait: Modified gait pattern (slower  gait speed, wider stride width, and longer stance duration) associated with morbid obesity Posture: Poor   Lower Extremity Exam    Side: Right lower extremity  Side: Left lower extremity  Stability: No instability observed          Stability: No instability observed          Skin & Extremity Inspection: Skin color, temperature, and hair growth are WNL. No peripheral edema or cyanosis. No masses, redness, swelling, asymmetry, or associated skin lesions. No contractures.  Skin & Extremity Inspection: Skin color, temperature, and hair growth are WNL. No peripheral edema or cyanosis. No masses, redness, swelling, asymmetry, or associated skin lesions. No contractures.  Functional ROM: Pain restricted ROM for hip and knee joints          Functional ROM: Pain restricted ROM for hip joint          Muscle Tone/Strength: Functionally intact. No obvious neuro-muscular anomalies detected.  Muscle Tone/Strength: Functionally intact. No obvious neuro-muscular anomalies detected.  Sensory (Neurological): Unimpaired        Sensory (Neurological): Unimpaired        DTR: Patellar: 1+: trace Achilles: deferred today Plantar: deferred today  DTR: Patellar: 1+: trace Achilles: deferred today Plantar: deferred today  Palpation: No palpable anomalies  Palpation: No palpable anomalies   Assessment  Primary Diagnosis & Pertinent Problem List: The primary encounter diagnosis was Chronic pain syndrome. Diagnoses of History of bilateral knee replacement, Primary osteoarthritis of both knees, Myofascial pain, and Lumbar spondylosis were also pertinent to  this visit.  Status Diagnosis  Controlled Controlled Controlled 1. Chronic pain syndrome   2. History of bilateral knee replacement   3. Primary osteoarthritis of both knees   4. Myofascial pain   5. Lumbar spondylosis      General Recommendations: The pain condition that the patient suffers from is best treated with a multidisciplinary approach that involves  an increase in physical activity to prevent de-conditioning and worsening of the pain cycle, as well as psychological counseling (formal and/or informal) to address the co-morbid psychological affects of pain. Treatment will often involve judicious use of pain medications and interventional procedures to decrease the pain, allowing the patient to participate in the physical activity that will ultimately produce long-lasting pain reductions. The goal of the multidisciplinary approach is to return the patient to a higher level of overall function and to restore their ability to perform activities of daily living.  72y.o. old female with a past medical history significant for bilateral knee replacements with bilateral knee pain. Patient s/pbilateral knee replacements done in [place] many years ago, left one was done in 2008 and right one was done in 2007. She states that these knee replacements were done "incorrectly". She states that her knees are misaligned as a result of the surgery. Patient is status post bilateral genicular nerve block for bilateral knee painon 9/19/18which was effective for her knee pain. Of note, shewas diagnosed withT3N0 stage II colon cancer and is s/p right hemicolectomyon October 17, 2017.   She states that overall her pain is well controlled on her current regimen of baclofen, gabapentin, andhydrocodone  Refill medications as below  Plan of Care  Pharmacotherapy (Medications Ordered): Meds ordered this encounter  Medications  . HYDROcodone-acetaminophen (NORCO) 7.5-325 MG tablet    Sig: Take 1 tablet by mouth every 8 (eight) hours as needed for up to 30 days for moderate pain.    Dispense:  90 tablet    Refill:  0  . HYDROcodone-acetaminophen (NORCO) 7.5-325 MG tablet    Sig: Take 1 tablet by mouth every 8 (eight) hours as needed for up to 30 days for moderate pain.    Dispense:  90 tablet    Refill:  0    Do not place this medication, or any other prescription  from our practice, on "Automatic Refill". Patient may have prescription filled one day early if pharmacy is closed on scheduled refill date.    Provider-requested follow-up: Return in about 2 months (around 07/14/2018) for Medication Management.  Future Appointments  Date Time Provider [place]  07/10/2018  1:15 PM Gillis Santa, MD [facility] None    Primary Care Physician: Patient, No Pcp Per Location: [facility] Outpatient Pain Management Facility Note by: Gillis Santa, M.D Date: 05/15/2018; Time: 2:25 PM  There are no Patient Instructions on file for this visit.

## 2018-05-29 ENCOUNTER — Encounter: Payer: Medicare HMO | Admitting: Student in an Organized Health Care Education/Training Program

## 2018-07-10 ENCOUNTER — Encounter: Payer: Medicare HMO | Admitting: Student in an Organized Health Care Education/Training Program

## 2018-07-24 ENCOUNTER — Ambulatory Visit
Payer: Medicare HMO | Attending: Student in an Organized Health Care Education/Training Program | Admitting: Student in an Organized Health Care Education/Training Program

## 2018-07-24 ENCOUNTER — Other Ambulatory Visit: Payer: Self-pay

## 2018-07-24 DIAGNOSIS — M170 Bilateral primary osteoarthritis of knee: Secondary | ICD-10-CM | POA: Diagnosis not present

## 2018-07-24 DIAGNOSIS — G894 Chronic pain syndrome: Secondary | ICD-10-CM | POA: Diagnosis not present

## 2018-07-24 DIAGNOSIS — M7918 Myalgia, other site: Secondary | ICD-10-CM | POA: Diagnosis not present

## 2018-07-24 DIAGNOSIS — M47816 Spondylosis without myelopathy or radiculopathy, lumbar region: Secondary | ICD-10-CM

## 2018-07-24 DIAGNOSIS — Z96653 Presence of artificial knee joint, bilateral: Secondary | ICD-10-CM

## 2018-07-24 MED ORDER — HYDROCODONE-ACETAMINOPHEN 7.5-325 MG PO TABS: 1.0000 | ORAL_TABLET | Freq: Three times a day (TID) | ORAL | 0 refills | Status: AC | PRN

## 2018-07-24 MED ORDER — HYDROCODONE-ACETAMINOPHEN 7.5-325 MG PO TABS: 1.0000 | ORAL_TABLET | Freq: Three times a day (TID) | ORAL | 0 refills | Status: DC | PRN

## 2018-07-24 NOTE — Progress Notes (Signed)
Pain Management Encounter Note - Virtual Visit via Telephone Telehealth (real-time audio visits between healthcare provider and patient).  Patient's Phone No. & Preferred Pharmacy:  [phone] (home); There is no such number on file (mobile).; (Preferred) [facility] [address] [place] - [address] [place] [address] Phone: [phone] Fax: [phone]   Pre-screening note:  Our staff contacted Cindy Rose and offered her an "in person", "face-to-face" appointment versus a telephone encounter. She indicated preferring the telephone encounter, at this time.  Reason for Virtual Visit: COVID-19*  Social distancing based on CDC and AMA recommendations.   I contacted Cindy Rose on 07/24/2018 at 1:28 PM by telephone and clearly identified myself as Gillis Santa, MD. I verified that I was speaking with the correct person using two identifiers (Name and date of birth: 1945-06-04).  Advanced Informed Consent I sought verbal advanced consent from Cindy Rose for telemedicine interactions and virtual visit. I informed Cindy Rose of the security and privacy concerns, risks, and limitations associated with performing an evaluation and management service by telephone. I also informed Cindy Rose of the availability of "in person" appointments and I informed her of the possibility of a patient responsible charge related to this service. Cindy Rose expressed understanding and agreed to proceed.   Historic Elements   Cindy Rose is a 73 y.o. year old, female patient evaluated today after her last encounter by our practice on 05/15/2018. Cindy Rose  has a past medical history of Allergy, Anxiety, Arthritis, Cancer ([place]) (09/2017), Hyperlipidemia, and Hypertension. She also  has a past surgical history that includes Joint replacement; Abdominal hysterectomy; and Wrist surgery. Cindy Rose has a current medication list which includes the following  prescription(s): atorvastatin, baclofen, calcium-magnesium-zinc, gabapentin, hydrochlorothiazide, hydrocodone-acetaminophen, hydrocodone-acetaminophen, hydrocodone-acetaminophen, losartan, naproxen, omeprazole, omeprazole, valsartan-hydrochlorothiazide, and valsartan-hydrochlorothiazide. She  reports that she quit smoking about 11 years ago. Her smoking use included cigarettes. She has never used smokeless tobacco. She reports that she does not drink alcohol or use drugs. Cindy Rose is allergic to tape and zoster vaccine live.   HPI  I last saw her on 05/15/2018. She is being evaluated for medication management.  No changes in medical history. Medication refill as below.  Pharmacotherapy Assessment   07/11/2018  1   05/15/2018  Hydrocodone-Acetamin 7.5-325  90.00 30 Bi Lat   [id number]   [facility] (7248)   0  22.50 MME  Medicare   [place]    Monitoring: Pharmacotherapy: No side-effects or adverse reactions reported. [place] PMP: PDMP reviewed during this encounter.       Compliance: No problems identified. Plan: Refer to "POC".  Review of recent tests  DG C-Arm 1-60 Min-No Report Fluoroscopy was utilized by the requesting physician.  No radiographic  interpretation.    Clinical Support on 02/27/2018  Component Date Value Ref Range Status  . Summary 02/27/2018 FINAL   Final   Comment: ==================================================================== TOXASSURE SELECT 13 (MW) ==================================================================== Test                             Result       Flag       Units Drug Present and Declared for Prescription Verification   Norhydrocodone                 603          EXPECTED   ng/mg creat    Norhydrocodone is an expected metabolite of  hydrocodone. Drug Absent but Declared for Prescription Verification   Hydrocodone                    Not Detected UNEXPECTED ng/mg creat    Hydrocodone is almost always present in patients taking this drug    consistently.  Absence of hydrocodone could be due to lapse of    time since the last dose or unusual pharmacokinetics (rapid    metabolism). ==================================================================== Test                      Result    Flag   Units      Ref Range   Creatinine              32               mg/dL      >=20 ============================================                          ======================== Declared Medications:  The flagging and interpretation on this report are based on the  following declared medications.  Unexpected results may arise from  inaccuracies in the declared medications.  **Note: The testing scope of this panel includes these medications:  Hydrocodone (Hydrocodone-Acetaminophen)  **Note: The testing scope of this panel does not include following  reported medications:  Acetaminophen (Hydrocodone-Acetaminophen)  Atorvastatin  Baclofen  Calcium  Gabapentin  Hydrochlorothiazide (Valsartan-Hydrochlorothizide)  Losartan (Losartan Potassium)  Magnesium  Naproxen  Omeprazole  Valsartan (Valsartan-Hydrochlorothizide)  Zinc ==================================================================== For clinical consultation, please call [phone]. ====================================================================    Assessment  The primary encounter diagnosis was Chronic pain syndrome. Diagnoses of History of bilateral knee replacement, Primary osteoarthritis of both knees, Myofascial pain, and Lumbar spondylosis were also pertinent to this visit.  Plan of Care  I am having Cindy Rose start on HYDROcodone-acetaminophen and HYDROcodone-acetaminophen. I am also having her maintain her omeprazole, valsartan-hydrochlorothiazide, CALCIUM-MAGNESIUM-ZINC PO, naproxen, atorvastatin, omeprazole, valsartan-hydrochlorothiazide, losartan, gabapentin, baclofen, hydrochlorothiazide, and HYDROcodone-acetaminophen.  Pharmacotherapy (Medications Ordered): Meds  ordered this encounter  Medications  . HYDROcodone-acetaminophen (NORCO) 7.5-325 MG tablet    Sig: Take 1 tablet by mouth every 8 (eight) hours as needed for up to 30 days for moderate pain.    Dispense:  90 tablet    Refill:  0    Do not place this medication, or any other prescription from our practice, on "Automatic Refill". Patient may have prescription filled one day early if pharmacy is closed on scheduled refill date.  Marland Kitchen HYDROcodone-acetaminophen (NORCO) 7.5-325 MG tablet    Sig: Take 1 tablet by mouth every 8 (eight) hours as needed for up to 30 days for severe pain. Must last 30 days.    Dispense:  90 tablet    Refill:  0    [place] STOP ACT - Not applicable. Fill one day early if pharmacy is closed on scheduled refill date.  Marland Kitchen HYDROcodone-acetaminophen (NORCO) 7.5-325 MG tablet    Sig: Take 1 tablet by mouth every 8 (eight) hours as needed for up to 30 days for severe pain. Must last 30 days.    Dispense:  90 tablet    Refill:  0    [place] STOP ACT - Not applicable. Fill one day early if pharmacy is closed on scheduled refill date.   Follow-up plan:   Return in about 3 months (around 10/23/2018) for Medication Management.   I discussed the assessment and treatment plan with  the patient. The patient was provided an opportunity to ask questions and all were answered. The patient agreed with the plan and demonstrated an understanding of the instructions.  Patient advised to call back or seek an in-person evaluation if the symptoms or condition worsens.  Total duration of non-face-to-face encounter: 61minutes.  Note by: Gillis Santa, MD Date: 07/24/2018; Time: 1:28 PM  Disclaimer:  * Given the special circumstances of the COVID-19 pandemic, the federal government has announced that the Office for Civil Rights (OCR) will exercise its enforcement discretion and will not impose penalties on physicians using telehealth in the event of noncompliance with regulatory requirements under the [place] and Accountability Act (HIPAA) in connection with the good faith provision of telehealth during the YYPEJ-61 national public health emergency. ([place])

## 2018-07-27 IMAGING — CR DG CERVICAL SPINE COMPLETE 4+V
1 series · 5 of 5 positions shown · non-contrast
Comparison: None.

CLINICAL DATA: Cervicalgia

EXAM:
CERVICAL SPINE - COMPLETE 4+ VIEW

[Series 1: dg cervical spine complete · 0.14mm/px · 5 of 5 slices shown]
[im 1/5]
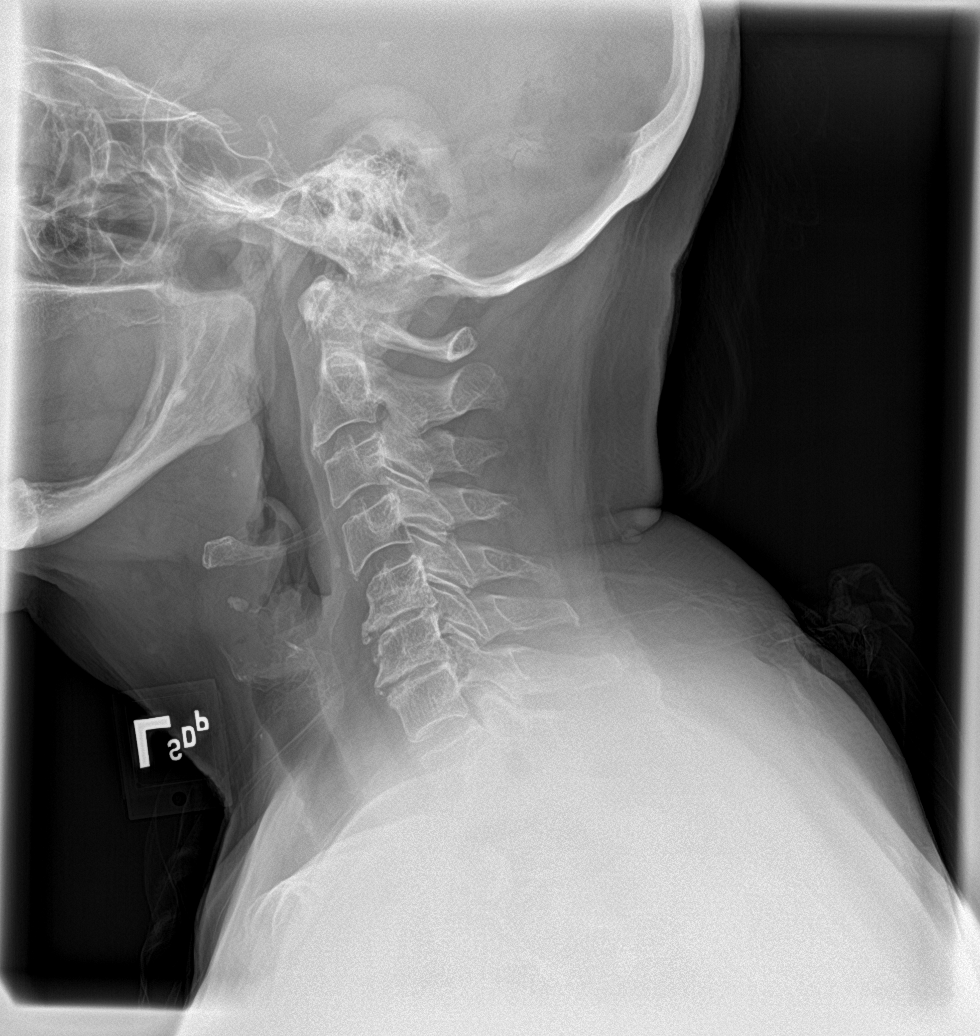
[im 2/5]
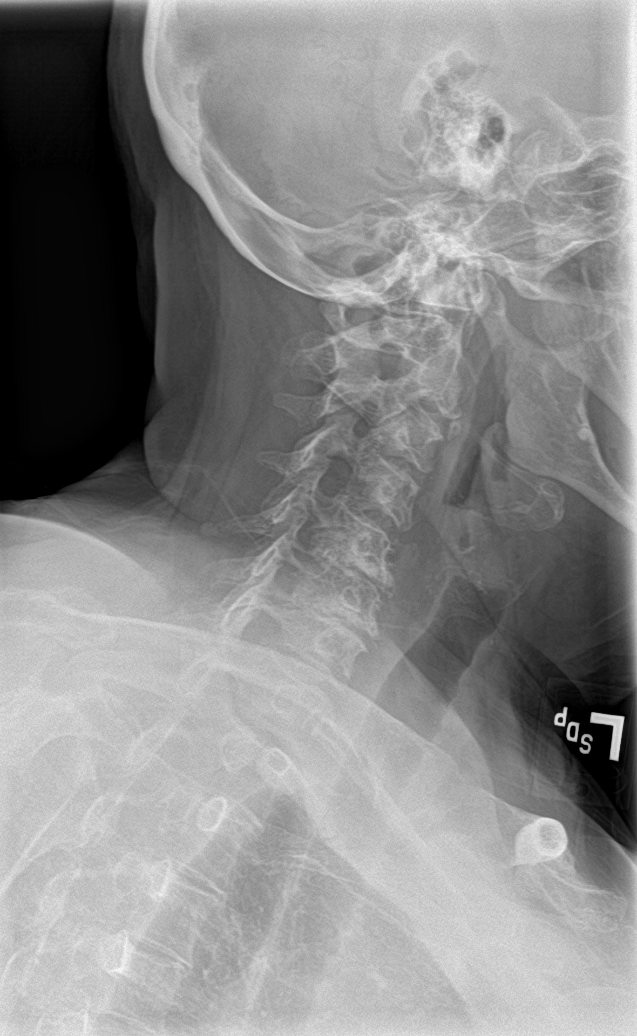
[im 3/5]
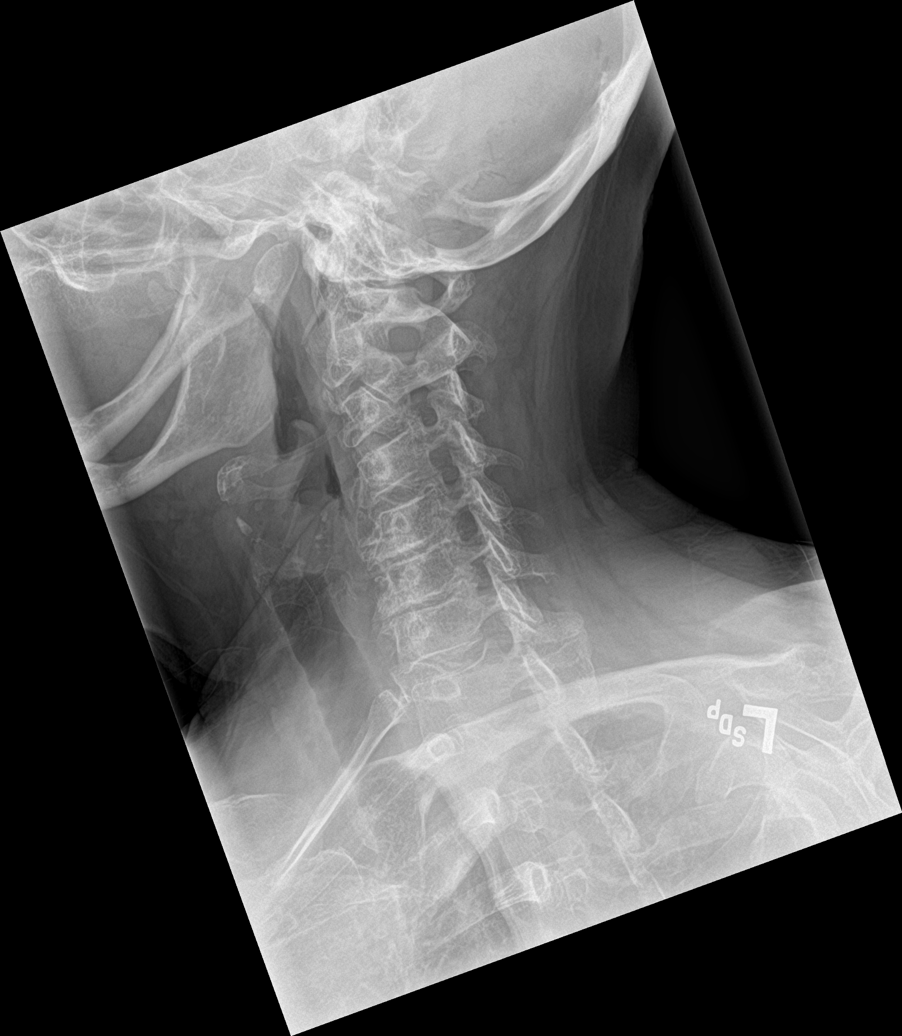
[im 4/5]
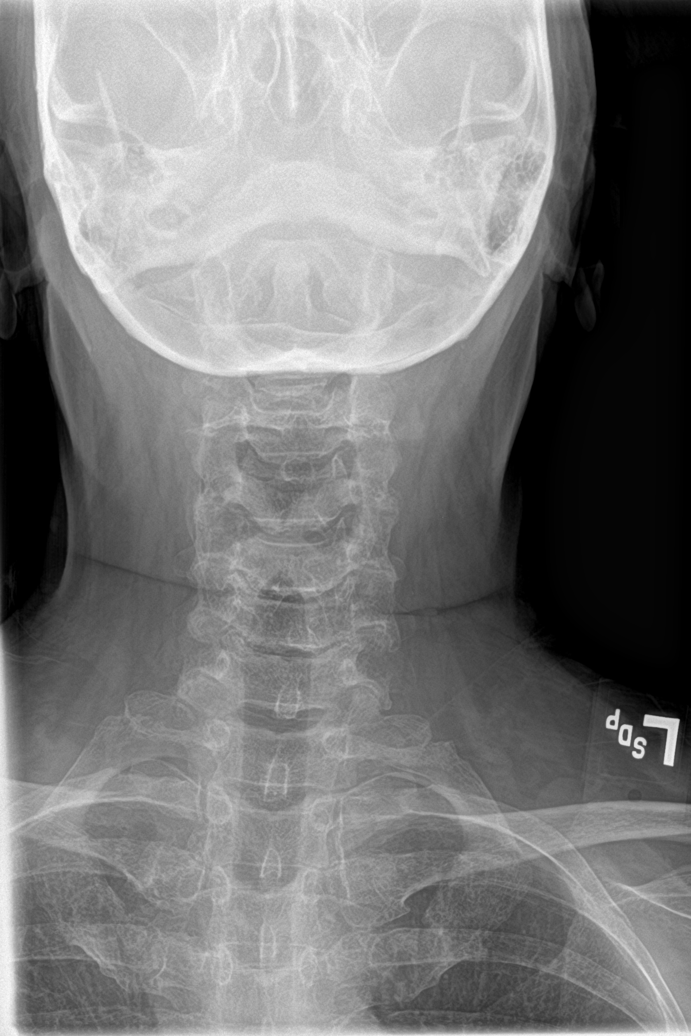
[im 5/5]
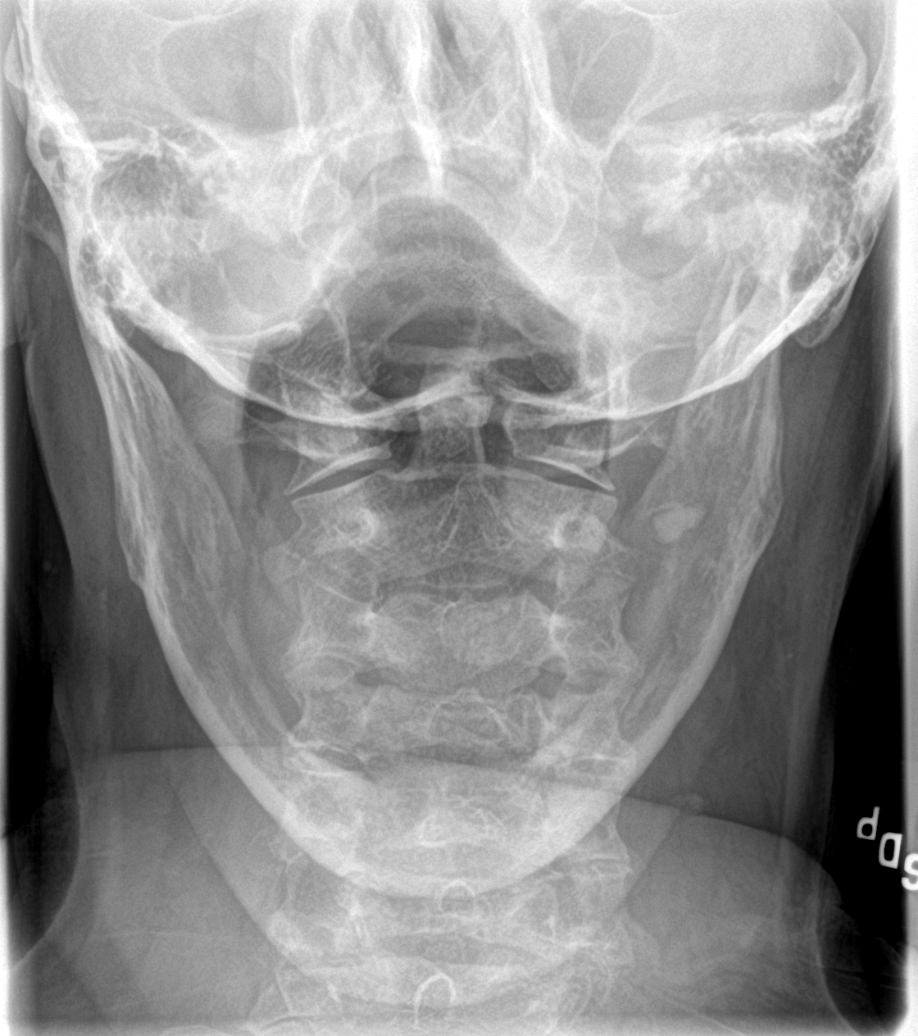

[5 of 5 positions shown; findings below may reference images not displayed]

FINDINGS: Frontal, lateral, open-mouth odontoid, and bilateral oblique views
were obtained. There is no fracture or spondylolisthesis.
Prevertebral soft tissues and predental space regions are normal.
There is marked disc space narrowing at C5-6 and C6-7. There is mild
disc space narrowing at C7-T1. There are prominent anterior
osteophytes at C2, C5, and C6. There is facet osteoarthritic change
with exit foraminal narrowing at C3-4 bilaterally, at C4-5 on the
left, at C5-6 bilaterally, and at lung apices are clear. C6-7
bilaterally.
IMPRESSION: Multilevel osteoarthritic change.  No fracture or spondylolisthesis.

## 2018-10-17 ENCOUNTER — Encounter: Payer: Self-pay | Admitting: Student in an Organized Health Care Education/Training Program

## 2018-10-17 ENCOUNTER — Ambulatory Visit
Payer: Medicare HMO | Attending: Student in an Organized Health Care Education/Training Program | Admitting: Student in an Organized Health Care Education/Training Program

## 2018-10-17 ENCOUNTER — Other Ambulatory Visit: Payer: Self-pay

## 2018-10-17 DIAGNOSIS — G894 Chronic pain syndrome: Secondary | ICD-10-CM | POA: Diagnosis not present

## 2018-10-17 DIAGNOSIS — M7918 Myalgia, other site: Secondary | ICD-10-CM

## 2018-10-17 DIAGNOSIS — M170 Bilateral primary osteoarthritis of knee: Secondary | ICD-10-CM

## 2018-10-17 DIAGNOSIS — Z96653 Presence of artificial knee joint, bilateral: Secondary | ICD-10-CM | POA: Diagnosis not present

## 2018-10-17 DIAGNOSIS — M47816 Spondylosis without myelopathy or radiculopathy, lumbar region: Secondary | ICD-10-CM

## 2018-10-17 MED ORDER — HYDROCODONE-ACETAMINOPHEN 7.5-325 MG PO TABS: 1.0000 | ORAL_TABLET | Freq: Three times a day (TID) | ORAL | 0 refills | Status: AC | PRN

## 2018-10-17 MED ORDER — HYDROCODONE-ACETAMINOPHEN 7.5-325 MG PO TABS: 1.0000 | ORAL_TABLET | Freq: Three times a day (TID) | ORAL | 0 refills | Status: DC | PRN

## 2018-10-17 NOTE — Progress Notes (Signed)
Pain Management Virtual Encounter Note - Virtual Visit via Telephone Telehealth (real-time audio visits between healthcare provider and patient).   Patient's Phone No. & Preferred Pharmacy:  [phone] (home); There is no such number on file (mobile).; (Preferred) [phone] No e-mail address on record  [place], [place] - [place] [address] [place] [address] Phone: [phone] Fax: [phone]    Pre-screening note:  Our staff contacted Cindy Rose and offered her an "in person", "face-to-face" appointment versus a telephone encounter. She indicated preferring the telephone encounter, at this time.   Reason for Virtual Visit: COVID-19*  Social distancing based on CDC and AMA recommendations.   I contacted Cindy Rose on 10/17/2018 via telephone.      I clearly identified myself as Gillis Santa, MD. I verified that I was speaking with the correct person using two identifiers (Name: Cindy Rose, and date of birth: 1945-12-28).  Advanced Informed Consent I sought verbal advanced consent from Cindy Rose for virtual visit interactions. I informed Cindy Rose of possible security and privacy concerns, risks, and limitations associated with providing "not-in-person" medical evaluation and management services. I also informed Cindy Rose of the availability of "in-person" appointments. Finally, I informed her that there would be a charge for the virtual visit and that she could be  personally, fully or partially, financially responsible for it. Ms. Carrender expressed understanding and agreed to proceed.   Historic Elements   Cindy Rose is a 73 y.o. year old, female patient evaluated today after her last encounter by our practice on 07/24/2018. Cindy Rose  has a past medical history of Allergy, Anxiety, Arthritis, Cancer ([place]) (09/2017), Hyperlipidemia, and Hypertension. She also  has a past surgical history that includes Joint replacement;  Abdominal hysterectomy; and Wrist surgery. Cindy Rose has a current medication list which includes the following prescription(s): atorvastatin, baclofen, calcium-magnesium-zinc, gabapentin, hydrochlorothiazide, hydrocodone-acetaminophen, hydrocodone-acetaminophen, hydrocodone-acetaminophen, losartan, naproxen, omeprazole, omeprazole, valsartan-hydrochlorothiazide, and valsartan-hydrochlorothiazide. She  reports that she quit smoking about 11 years ago. Her smoking use included cigarettes. She has never used smokeless tobacco. She reports that she does not drink alcohol or use drugs. Cindy Rose is allergic to tape and zoster vaccine live.   HPI  Today, she is being contacted for medication management.  Pharmacotherapy Assessment  Analgesic: 09/11/2018  1   07/24/2018  Hydrocodone-Acetamin 7.5-325  90.00 30 Bi Lat   [id number]   [facility] (7248)   0  22.50 MME  Medicare   [place]  08/10/2018  1   07/24/2018  Hydrocodone-Acetamin 7.5-325  90.00 30 Bi Lat   [id number]   [facility] (7248)   0  22.50 MME  Medicare   [place]    Monitoring: Pharmacotherapy: No side-effects or adverse reactions reported.  PMP: PDMP reviewed during this encounter.       Compliance: No problems identified. Effectiveness: Clinically acceptable. Plan: Refer to "POC".  Pertinent Labs   SAFETY SCREENING Profile No results found for: SARSCOV2NAA, COVIDSOURCE, STAPHAUREUS, MRSAPCR, HCVAB, HIV, PREGTESTUR Renal Function No results found for: BUN, CREATININE, BCR, GFRAA, GFRNONAA Hepatic Function No results found for: AST, ALT, ALBUMIN UDS Summary  Date Value Ref Range Status  02/27/2018 FINAL  Final    Comment:    ==================================================================== TOXASSURE SELECT 13 (MW) ==================================================================== Test                             Result       Flag  Units Drug Present and Declared for Prescription Verification   Norhydrocodone                 603           EXPECTED   ng/mg creat    Norhydrocodone is an expected metabolite of hydrocodone. Drug Absent but Declared for Prescription Verification   Hydrocodone                    Not Detected UNEXPECTED ng/mg creat    Hydrocodone is almost always present in patients taking this drug    consistently. Absence of hydrocodone could be due to lapse of    time since the last dose or unusual pharmacokinetics (rapid    metabolism). ==================================================================== Test                      Result    Flag   Units      Ref Range   Creatinine              32               mg/dL      >=20 ==================================================================== Declared Medications:  The flagging and interpretation on this report are based on the  following declared medications.  Unexpected results may arise from  inaccuracies in the declared medications.  **Note: The testing scope of this panel includes these medications:  Hydrocodone (Hydrocodone-Acetaminophen)  **Note: The testing scope of this panel does not include following  reported medications:  Acetaminophen (Hydrocodone-Acetaminophen)  Atorvastatin  Baclofen  Calcium  Gabapentin  Hydrochlorothiazide (Valsartan-Hydrochlorothizide)  Losartan (Losartan Potassium)  Magnesium  Naproxen  Omeprazole  Valsartan (Valsartan-Hydrochlorothizide)  Zinc ==================================================================== For clinical consultation, please call [phone]. ====================================================================    Note: Above Lab results reviewed.  Recent imaging  DG C-Arm 1-60 Min-No Report Fluoroscopy was utilized by the requesting physician.  No radiographic  interpretation.   Assessment  The primary encounter diagnosis was Chronic pain syndrome. Diagnoses of History of bilateral knee replacement, Primary osteoarthritis of both knees, Myofascial pain, and Lumbar spondylosis were  also pertinent to this visit.  Plan of Care  I have changed Cindy Rose's HYDROcodone-acetaminophen. I am also having her start on HYDROcodone-acetaminophen and HYDROcodone-acetaminophen. Additionally, I am having her maintain her omeprazole, valsartan-hydrochlorothiazide, CALCIUM-MAGNESIUM-ZINC PO, naproxen, atorvastatin, omeprazole, valsartan-hydrochlorothiazide, losartan, gabapentin, baclofen, and hydrochlorothiazide.  Pharmacotherapy (Medications Ordered): Meds ordered this encounter  Medications  . HYDROcodone-acetaminophen (NORCO) 7.5-325 MG tablet    Sig: Take 1 tablet by mouth every 8 (eight) hours as needed for severe pain. Must last 30 days.    Dispense:  90 tablet    Refill:  0    [place] STOP ACT - Not applicable. Fill one day early if pharmacy is closed on scheduled refill date.  Marland Kitchen HYDROcodone-acetaminophen (NORCO) 7.5-325 MG tablet    Sig: Take 1 tablet by mouth every 8 (eight) hours as needed for severe pain. Must last 30 days.    Dispense:  90 tablet    Refill:  0    [place] STOP ACT - Not applicable. Fill one day early if pharmacy is closed on scheduled refill date.  Marland Kitchen HYDROcodone-acetaminophen (NORCO) 7.5-325 MG tablet    Sig: Take 1 tablet by mouth every 8 (eight) hours as needed for severe pain. Must last 30 days.    Dispense:  90 tablet    Refill:  0    [place] STOP ACT - Not applicable. Fill one day early  if pharmacy is closed on scheduled refill date.   Orders:  No orders of the defined types were placed in this encounter.  Follow-up plan:   Return in about 15 weeks (around 01/30/2019) for Medication Management.        Recent Visits Date Type Provider Dept  07/24/18 Office Visit Gillis Santa, MD [facility]  Showing recent visits within past 90 days and meeting all other requirements   Today's Visits Date Type Provider Dept  10/17/18 Office Visit Gillis Santa, MD [facility]  Showing today's visits and meeting all other requirements   Future  Appointments No visits were found meeting these conditions.  Showing future appointments within next 90 days and meeting all other requirements   I discussed the assessment and treatment plan with the patient. The patient was provided an opportunity to ask questions and all were answered. The patient agreed with the plan and demonstrated an understanding of the instructions.  Patient advised to call back or seek an in-person evaluation if the symptoms or condition worsens.  Total duration of non-face-to-face encounter: 25 minutes.  Note by: Gillis Santa, MD Date: 10/17/2018; Time: 3:12 PM  Note: This dictation was prepared with Dragon dictation. Any transcriptional errors that may result from this process are unintentional.  Disclaimer:  * Given the special circumstances of the COVID-19 pandemic, the federal government has announced that the Office for Civil Rights (OCR) will exercise its enforcement discretion and will not impose penalties on physicians using telehealth in the event of noncompliance with regulatory requirements under the [place] and [place] (HIPAA) in connection with the good faith provision of telehealth during the UEKCM-03 national public health emergency. ([place])

## 2018-12-11 ENCOUNTER — Other Ambulatory Visit: Payer: Self-pay | Admitting: Student in an Organized Health Care Education/Training Program

## 2019-01-21 ENCOUNTER — Encounter: Payer: Self-pay | Admitting: Student in an Organized Health Care Education/Training Program

## 2019-01-22 ENCOUNTER — Ambulatory Visit: Payer: Medicare HMO | Admitting: Student in an Organized Health Care Education/Training Program

## 2019-01-22 ENCOUNTER — Other Ambulatory Visit: Payer: Self-pay

## 2019-01-22 ENCOUNTER — Encounter: Payer: Self-pay | Admitting: Student in an Organized Health Care Education/Training Program

## 2019-01-22 ENCOUNTER — Ambulatory Visit
Payer: Medicare HMO | Attending: Student in an Organized Health Care Education/Training Program | Admitting: Student in an Organized Health Care Education/Training Program

## 2019-01-22 DIAGNOSIS — M170 Bilateral primary osteoarthritis of knee: Secondary | ICD-10-CM

## 2019-01-22 DIAGNOSIS — G894 Chronic pain syndrome: Secondary | ICD-10-CM

## 2019-01-22 DIAGNOSIS — Z96653 Presence of artificial knee joint, bilateral: Secondary | ICD-10-CM | POA: Diagnosis not present

## 2019-01-22 DIAGNOSIS — C182 Malignant neoplasm of ascending colon: Secondary | ICD-10-CM

## 2019-01-22 DIAGNOSIS — M47816 Spondylosis without myelopathy or radiculopathy, lumbar region: Secondary | ICD-10-CM

## 2019-01-22 MED ORDER — GABAPENTIN 300 MG PO CAPS: 300.0000 mg | ORAL_CAPSULE | Freq: Two times a day (BID) | ORAL | 6 refills | Status: DC

## 2019-01-22 MED ORDER — HYDROCODONE-ACETAMINOPHEN 7.5-325 MG PO TABS: 1.0000 | ORAL_TABLET | Freq: Three times a day (TID) | ORAL | 0 refills | Status: AC | PRN

## 2019-01-22 MED ORDER — BACLOFEN 10 MG PO TABS: 10.0000 mg | ORAL_TABLET | Freq: Two times a day (BID) | ORAL | 6 refills | Status: DC | PRN

## 2019-01-22 MED ORDER — HYDROCODONE-ACETAMINOPHEN 7.5-325 MG PO TABS: 1.0000 | ORAL_TABLET | Freq: Three times a day (TID) | ORAL | 0 refills | Status: DC | PRN

## 2019-01-22 NOTE — Progress Notes (Signed)
Pain Management Virtual Encounter Note - Virtual Visit via [place] (real-time audio visits between healthcare provider and patient).   Patient's Phone No. & Preferred Pharmacy:  [phone] (home); There is no such number on file (mobile).; (Preferred) [phone] No e-mail address on record  [name], [name] [address] [place] [address] Phone: [phone] Fax: [phone]    Pre-screening note:  Our staff contacted Cindy Rose and offered her an "in person", "face-to-face" appointment versus a telephone encounter. She indicated preferring the telephone encounter, at this time.   Reason for Virtual Visit: COVID-19*  Social distancing based on CDC and AMA recommendations.   I contacted Aide Daponte on 01/22/2019 via video conference.      I clearly identified myself as Cindy Santa, MD. I verified that I was speaking with the correct person using two identifiers (Name: Cindy Rose, and date of birth: 08/07/1945).  Advanced Informed Consent I sought verbal advanced consent from Cindy Rose for virtual visit interactions. I informed Cindy Rose of possible security and privacy concerns, risks, and limitations associated with providing "not-in-person" medical evaluation and management services. I also informed Ms. Smart of the availability of "in-person" appointments. Finally, I informed her that there would be a charge for the virtual visit and that she could be  personally, fully or partially, financially responsible for it. Cindy Rose expressed understanding and agreed to proceed.   Historic Elements   Cindy Rose is a 73 y.o. year old, female patient evaluated today after her last encounter by our practice on 12/11/2018. Cindy Rose  has a past medical history of Allergy, Anxiety, Arthritis, Cancer ([place]) (09/2017), Hyperlipidemia, and Hypertension. She also  has a past surgical history that includes Joint  replacement; Abdominal hysterectomy; and Wrist surgery. [name] has a current medication list which includes the following prescription(s): atorvastatin, baclofen, calcium-magnesium-zinc, gabapentin, hydrochlorothiazide, hydrocodone-acetaminophen, hydrocodone-acetaminophen, hydrocodone-acetaminophen, losartan, naproxen, omeprazole, omeprazole, valsartan-hydrochlorothiazide, and valsartan-hydrochlorothiazide. She  reports that she quit smoking about 12 years ago. Her smoking use included cigarettes. She has never used smokeless tobacco. She reports that she does not drink alcohol or use drugs. Cindy Rose is allergic to tape and zoster vaccine live.   HPI  Today, she is being contacted for medication management.   No change in medical history since last visit.  Patient's pain is at baseline.  Patient continues multimodal pain regimen as prescribed.  States that it provides pain relief and improvement in functional status.   Pharmacotherapy Assessment  Analgesic: Hydrocodone 7.5 mg 3 times daily PRN, quantity 90/month, MME equals 22.5   Monitoring: Pharmacotherapy: No side-effects or adverse reactions reported. [name]: PDMP reviewed during this encounter.       Compliance: No problems identified. Effectiveness: Clinically acceptable. Plan: Refer to "POC".  UDS:  Summary  Date Value Ref Range Status  02/27/2018 FINAL  Final    Comment:    ==================================================================== TOXASSURE SELECT 13 (MW) ==================================================================== Test                             Result       Flag       Units Drug Present and Declared for Prescription Verification   Norhydrocodone                 603          EXPECTED   ng/mg creat    Norhydrocodone is an expected metabolite  of hydrocodone. Drug Absent but Declared for Prescription Verification   Hydrocodone                    Not Detected UNEXPECTED ng/mg creat    Hydrocodone  is almost always present in patients taking this drug    consistently. Absence of hydrocodone could be due to lapse of    time since the last dose or unusual pharmacokinetics (rapid    metabolism). ==================================================================== Test                      Result    Flag   Units      Ref Range   Creatinine              32               mg/dL      >=20 ==================================================================== Declared Medications:  The flagging and interpretation on this report are based on the  following declared medications.  Unexpected results may arise from  inaccuracies in the declared medications.  **Note: The testing scope of this panel includes these medications:  Hydrocodone (Hydrocodone-Acetaminophen)  **Note: The testing scope of this panel does not include following  reported medications:  Acetaminophen (Hydrocodone-Acetaminophen)  Atorvastatin  Baclofen  Calcium  Gabapentin  Hydrochlorothiazide (Valsartan-Hydrochlorothizide)  Losartan (Losartan Potassium)  Magnesium  Naproxen  Omeprazole  Valsartan (Valsartan-Hydrochlorothizide)  Zinc ==================================================================== For clinical consultation, please call [phone]. ====================================================================     Assessment  The primary encounter diagnosis was Chronic pain syndrome. Diagnoses of History of bilateral knee replacement, Primary osteoarthritis of both knees, Malignant neoplasm of ascending colon ([place]), and Lumbar spondylosis were also pertinent to this visit.  Plan of Care  I have changed Cindy Rose's baclofen. I am also having her start on HYDROcodone-acetaminophen and HYDROcodone-acetaminophen. Additionally, I am having her maintain her omeprazole, valsartan-hydrochlorothiazide, CALCIUM-MAGNESIUM-ZINC PO, naproxen, atorvastatin, omeprazole, valsartan-hydrochlorothiazide, losartan,  hydrochlorothiazide, gabapentin, and HYDROcodone-acetaminophen.  Pharmacotherapy (Medications Ordered): Meds ordered this encounter  Medications  . baclofen (LIORESAL) 10 MG tablet    Sig: Take 1 tablet (10 mg total) by mouth 2 (two) times daily as needed for muscle spasms.    Dispense:  60 tablet    Refill:  6    Do not place this medication, or any other prescription from our practice, on "Automatic Refill". Patient may have prescription filled one day early if pharmacy is closed on scheduled refill date.  . gabapentin (NEURONTIN) 300 MG capsule    Sig: Take 1 capsule (300 mg total) by mouth 2 (two) times daily.    Dispense:  60 capsule    Refill:  6  . HYDROcodone-acetaminophen (NORCO) 7.5-325 MG tablet    Sig: Take 1 tablet by mouth every 8 (eight) hours as needed for severe pain. Must last 30 days.    Dispense:  90 tablet    Refill:  0    [place] STOP ACT - Not applicable. Fill one day early if pharmacy is closed on scheduled refill date.  [place] HYDROcodone-acetaminophen (NORCO) 7.5-325 MG tablet    Sig: Take 1 tablet by mouth every 8 (eight) hours as needed for severe pain. Must last 30 days.    Dispense:  90 tablet    Refill:  0    [place] STOP ACT - Not applicable. Fill one day early if pharmacy is closed on scheduled refill date.  Marland Kitchen HYDROcodone-acetaminophen (NORCO) 7.5-325 MG tablet    Sig: Take 1 tablet by mouth every  8 (eight) hours as needed for severe pain. Must last 30 days.    Dispense:  90 tablet    Refill:  0    [place] STOP ACT - Not applicable. Fill one day early if pharmacy is closed on scheduled refill date.   Follow-up plan:   Return in about 15 weeks (around 05/07/2019) for Medication Management, in person (need UDS).    Recent Visits No visits were found meeting these conditions.  Showing recent visits within past 90 days and meeting all other requirements   Today's Visits Date Type Provider Dept  01/22/19 Office Visit Cindy Santa, MD [facility]  Showing  today's visits and meeting all other requirements   Future Appointments No visits were found meeting these conditions.  Showing future appointments within next 90 days and meeting all other requirements   I discussed the assessment and treatment plan with the patient. The patient was provided an opportunity to ask questions and all were answered. The patient agreed with the plan and demonstrated an understanding of the instructions.  Patient advised to call back or seek an in-person evaluation if the symptoms or condition worsens.  Total duration of non-face-to-face encounter: 66minutes.  Note by: Cindy Santa, MD Date: 01/22/2019; Time: 2:29 PM  Note: This dictation was prepared with Dragon dictation. Any transcriptional errors that may result from this process are unintentional.  Disclaimer:  * Given the special circumstances of the COVID-19 pandemic, the federal government has announced that the Office for Civil Rights (OCR) will exercise its enforcement discretion and will not impose penalties on physicians using telehealth in the event of noncompliance with regulatory requirements under the [place] and [place] (HIPAA) in connection with the good faith provision of telehealth during the XX123456 national public health emergency. ([place])

## 2019-01-29 ENCOUNTER — Encounter: Payer: Medicare HMO | Admitting: Student in an Organized Health Care Education/Training Program

## 2019-04-19 HISTORY — PX: HERNIA REPAIR: SHX51

## 2019-05-01 ENCOUNTER — Encounter: Payer: Self-pay | Admitting: Student in an Organized Health Care Education/Training Program

## 2019-05-02 ENCOUNTER — Encounter: Payer: Self-pay | Admitting: Student in an Organized Health Care Education/Training Program

## 2019-05-02 ENCOUNTER — Ambulatory Visit
Payer: Medicare HMO | Attending: Student in an Organized Health Care Education/Training Program | Admitting: Student in an Organized Health Care Education/Training Program

## 2019-05-02 ENCOUNTER — Other Ambulatory Visit: Payer: Self-pay

## 2019-05-02 DIAGNOSIS — M170 Bilateral primary osteoarthritis of knee: Secondary | ICD-10-CM

## 2019-05-02 DIAGNOSIS — G894 Chronic pain syndrome: Secondary | ICD-10-CM

## 2019-05-02 DIAGNOSIS — M47816 Spondylosis without myelopathy or radiculopathy, lumbar region: Secondary | ICD-10-CM | POA: Diagnosis not present

## 2019-05-02 DIAGNOSIS — M5136 Other intervertebral disc degeneration, lumbar region: Secondary | ICD-10-CM

## 2019-05-02 DIAGNOSIS — Z96653 Presence of artificial knee joint, bilateral: Secondary | ICD-10-CM | POA: Diagnosis not present

## 2019-05-02 MED ORDER — HYDROCODONE-ACETAMINOPHEN 7.5-325 MG PO TABS: 1.0000 | ORAL_TABLET | Freq: Three times a day (TID) | ORAL | 0 refills | Status: AC | PRN

## 2019-05-02 MED ORDER — HYDROCODONE-ACETAMINOPHEN 7.5-325 MG PO TABS: 1.0000 | ORAL_TABLET | Freq: Three times a day (TID) | ORAL | 0 refills | Status: DC | PRN

## 2019-05-02 MED ORDER — GABAPENTIN 300 MG PO CAPS: 300.0000 mg | ORAL_CAPSULE | Freq: Three times a day (TID) | ORAL | 5 refills | Status: DC

## 2019-05-02 NOTE — Progress Notes (Signed)
Patient: Cindy Rose  Service Category: E/M  Provider: Gillis Santa, MD  DOB: 1946-03-15  DOS: 05/02/2019  Location: Office  MRN: [id number]  Setting: Ambulatory outpatient  Referring Provider: No ref. provider found  Type: Established Patient  Specialty: Interventional Pain Management  PCP: Patient, No Pcp Per  Location: Home  Delivery: TeleHealth     Virtual Encounter - Pain Management PROVIDER NOTE: Information contained herein reflects review and annotations entered in association with encounter. Interpretation of such information and data should be left to medically-trained personnel. Information provided to patient can be located elsewhere in the medical record under "Patient Instructions". Document created using STT-dictation technology, any transcriptional errors that may result from process are unintentional.    Contact & Pharmacy Preferred: [phone] Home: [phone] (home) Mobile: There is no such number on file (mobile). E-mail: No e-mail address on record  [place], [place] - [place] [address] [place] [address] Phone: [phone] Fax: [phone]   Pre-screening  Cindy Rose offered "in-person" vs "virtual" encounter. She indicated preferring virtual for this encounter.   Reason COVID-19*  Social distancing based on CDC and AMA recommendations.   I contacted Cindy Rose on 05/02/2019 via video conference.      I clearly identified myself as Gillis Santa, MD. I verified that I was speaking with the correct person using two identifiers (Name: Cindy Rose, and date of birth: [birth year]).  Consent I sought verbal advanced consent from Cindy Rose for virtual visit interactions. I informed Cindy Rose of possible security and privacy concerns, risks, and limitations associated with providing "not-in-person" medical evaluation and management services. I also informed Cindy Rose of the availability of "in-person" appointments.  Finally, I informed her that there would be a charge for the virtual visit and that she could be  personally, fully or partially, financially responsible for it. Cindy Rose expressed understanding and agreed to proceed.   Historic Elements   Cindy Rose is a 74 y.o. year old, female patient evaluated today after her last encounter by our practice on 01/22/2019. Cindy Rose  has a past medical history of Allergy, Anxiety, Arthritis, Cancer ([place]) (09/2017), Hyperlipidemia, and Hypertension. She also  has a past surgical history that includes Joint replacement; Abdominal hysterectomy; and Wrist surgery. Cindy Rose has a current medication list which includes the following prescription(s): atorvastatin, baclofen, calcium-magnesium-zinc, hydrochlorothiazide, losartan, naproxen, omeprazole, valsartan-hydrochlorothiazide, gabapentin, [START ON 05/15/2019] hydrocodone-acetaminophen, [START ON 06/14/2019] hydrocodone-acetaminophen, [START ON 07/14/2019] hydrocodone-acetaminophen, omeprazole, and valsartan-hydrochlorothiazide. She  reports that she quit smoking about 12 years ago. Her smoking use included cigarettes. She has never used smokeless tobacco. She reports that she does not drink alcohol or use drugs. Cindy Rose is allergic to tape and zoster vaccine live.   HPI  Today, she is being contacted for medication management.   Having increased right low back pain. Patient states that she has been working more than she usually does due to being short staffed. Feels that it is from over-exertion.  Lumbar spine xray done 12/22 negative for fracture but showed osteopenia and diffuse lumbar DDD. Meeting with Orthopedics soon.  Has increased Gabapentin to 300 mg TID, will provide refill in addition to Hydrocodone as below.  Pharmacotherapy Assessment  Analgesic: Hydrocodone 7.5 mg 3 times daily PRN, quantity 90/month, MME equals 22.5   Monitoring: Pharmacotherapy: No side-effects or adverse  reactions reported. [place] PMP: PDMP reviewed during this encounter.       Compliance: No problems identified. Effectiveness: Clinically acceptable. Plan: Refer  to "POC".  UDS:  Summary  Date Value Ref Range Status  02/27/2018 FINAL  Final    Comment:    ==================================================================== TOXASSURE SELECT 13 (MW) ==================================================================== Test                             Result       Flag       Units Drug Present and Declared for Prescription Verification   Norhydrocodone                 603          EXPECTED   ng/mg creat    Norhydrocodone is an expected metabolite of hydrocodone. Drug Absent but Declared for Prescription Verification   Hydrocodone                    Not Detected UNEXPECTED ng/mg creat    Hydrocodone is almost always present in patients taking this drug    consistently. Absence of hydrocodone could be due to lapse of    time since the last dose or unusual pharmacokinetics (rapid    metabolism). ==================================================================== Test                      Result    Flag   Units      Ref Range   Creatinine              32               mg/dL      >=20 ==================================================================== Declared Medications:  The flagging and interpretation on this report are based on the  following declared medications.  Unexpected results may arise from  inaccuracies in the declared medications.  **Note: The testing scope of this panel includes these medications:  Hydrocodone (Hydrocodone-Acetaminophen)  **Note: The testing scope of this panel does not include following  reported medications:  Acetaminophen (Hydrocodone-Acetaminophen)  Atorvastatin  Baclofen  Calcium  Gabapentin  Hydrochlorothiazide (Valsartan-Hydrochlorothizide)  Losartan (Losartan Potassium)  Magnesium  Naproxen  Omeprazole  Valsartan  (Valsartan-Hydrochlorothizide)  Zinc ==================================================================== For clinical consultation, please call [phone]. ====================================================================     Assessment  The primary encounter diagnosis was Chronic pain syndrome. Diagnoses of History of bilateral knee replacement, Primary osteoarthritis of both knees, Lumbar spondylosis, and Lumbar degenerative disc disease were also pertinent to this visit.  Plan of Care  I have changed Verla Wambold's gabapentin. I am also having her start on HYDROcodone-acetaminophen and HYDROcodone-acetaminophen. Additionally, I am having her maintain her omeprazole, valsartan-hydrochlorothiazide, CALCIUM-MAGNESIUM-ZINC PO, naproxen, atorvastatin, omeprazole, valsartan-hydrochlorothiazide, losartan, hydrochlorothiazide, baclofen, and HYDROcodone-acetaminophen.  Reviewed xray results. Discussed PT exercises she could do at home. Acute on chronic back pain likely MSK related to do over-exertion and being on her feet for an extended period of time at work. Reinforced importance of orthotics and shoes. Increase Gabapentin to 300 mg TID as below. Refill Hydrocodone. Continue Baclofen and Naproxen prn, no refills needed.   Pharmacotherapy (Medications Ordered): Meds ordered this encounter  Medications  . gabapentin (NEURONTIN) 300 MG capsule    Sig: Take 1 capsule (300 mg total) by mouth 3 (three) times daily.    Dispense:  90 capsule    Refill:  5  . HYDROcodone-acetaminophen (NORCO) 7.5-325 MG tablet    Sig: Take 1 tablet by mouth every 8 (eight) hours as needed for severe pain. Must last 30 days.    Dispense:  90 tablet  Refill:  0    [place] STOP ACT - Not applicable. Fill one day early if pharmacy is closed on scheduled refill date.  [place] HYDROcodone-acetaminophen (NORCO) 7.5-325 MG tablet    Sig: Take 1 tablet by mouth every 8 (eight) hours as needed for severe pain. Must last 30  days.    Dispense:  90 tablet    Refill:  0    [place] STOP ACT - Not applicable. Fill one day early if pharmacy is closed on scheduled refill date.  Marland Kitchen HYDROcodone-acetaminophen (NORCO) 7.5-325 MG tablet    Sig: Take 1 tablet by mouth every 8 (eight) hours as needed for severe pain. Must last 30 days.    Dispense:  90 tablet    Refill:  0     STOP ACT - Not applicable. Fill one day early if pharmacy is closed on scheduled refill date.    Follow-up plan:   Return in about 3 months (around 07/31/2019).    Recent Visits No visits were found meeting these conditions.  Showing recent visits within past 90 days and meeting all other requirements   Today's Visits Date Type Provider Dept  05/02/19 Office Visit Gillis Santa, MD [facility]  Showing today's visits and meeting all other requirements   Future Appointments No visits were found meeting these conditions.  Showing future appointments within next 90 days and meeting all other requirements   I discussed the assessment and treatment plan with the patient. The patient was provided an opportunity to ask questions and all were answered. The patient agreed with the plan and demonstrated an understanding of the instructions.  Patient advised to call back or seek an in-person evaluation if the symptoms or condition worsens.  Total duration of non-face-to-face encounter: 59minutes.  Note by: Gillis Santa, MD Date: 05/02/2019; Time: 11:45 AM

## 2019-05-07 ENCOUNTER — Encounter: Payer: Medicare HMO | Admitting: Student in an Organized Health Care Education/Training Program

## 2019-07-22 ENCOUNTER — Encounter: Payer: Self-pay | Admitting: Student in an Organized Health Care Education/Training Program

## 2019-07-22 ENCOUNTER — Telehealth: Payer: Self-pay

## 2019-07-22 NOTE — Telephone Encounter (Signed)
No answer when calling about tomorrows visit.

## 2019-07-23 ENCOUNTER — Other Ambulatory Visit: Payer: Self-pay

## 2019-07-23 ENCOUNTER — Ambulatory Visit
Payer: Medicare HMO | Attending: Student in an Organized Health Care Education/Training Program | Admitting: Student in an Organized Health Care Education/Training Program

## 2019-07-23 ENCOUNTER — Encounter: Payer: Self-pay | Admitting: Student in an Organized Health Care Education/Training Program

## 2019-07-23 DIAGNOSIS — M170 Bilateral primary osteoarthritis of knee: Secondary | ICD-10-CM | POA: Diagnosis not present

## 2019-07-23 DIAGNOSIS — M47816 Spondylosis without myelopathy or radiculopathy, lumbar region: Secondary | ICD-10-CM

## 2019-07-23 DIAGNOSIS — M5136 Other intervertebral disc degeneration, lumbar region: Secondary | ICD-10-CM

## 2019-07-23 DIAGNOSIS — Z96653 Presence of artificial knee joint, bilateral: Secondary | ICD-10-CM

## 2019-07-23 DIAGNOSIS — G894 Chronic pain syndrome: Secondary | ICD-10-CM

## 2019-07-23 MED ORDER — HYDROCODONE-ACETAMINOPHEN 7.5-325 MG PO TABS: 1.0000 | ORAL_TABLET | Freq: Three times a day (TID) | ORAL | 0 refills | Status: DC | PRN

## 2019-07-23 MED ORDER — HYDROCODONE-ACETAMINOPHEN 7.5-325 MG PO TABS: 1.0000 | ORAL_TABLET | Freq: Three times a day (TID) | ORAL | 0 refills | Status: AC | PRN

## 2019-07-23 NOTE — Progress Notes (Signed)
Patient: Cindy Rose  Service Category: E/M  Provider: Gillis Santa, MD  DOB: 1945/08/14  DOS: 07/23/2019  Location: Office  MRN: [id number]  Setting: Ambulatory outpatient  Referring Provider: No ref. provider found  Type: Established Patient  Specialty: Interventional Pain Management  PCP: Patient, No Pcp Per  Location: Home  Delivery: TeleHealth     Virtual Encounter - Pain Management PROVIDER NOTE: Information contained herein reflects review and annotations entered in association with encounter. Interpretation of such information and data should be left to medically-trained personnel. Information provided to patient can be located elsewhere in the medical record under "Patient Instructions". Document created using STT-dictation technology, any transcriptional errors that may result from process are unintentional.    Contact & Pharmacy Preferred: [phone] Home: [phone] (home) Mobile: [phone] (mobile) E-mail: No e-mail address on record  [place], [place] [address] [place] [address] Phone: [phone] Fax: [phone]   Pre-screening  Ms. Shed offered "in-person" vs "virtual" encounter. She indicated preferring virtual for this encounter.   Reason COVID-19*  Social distancing based on CDC and AMA recommendations.   I contacted Tayla Panozzo on 07/23/2019 via telephone.      I clearly identified myself as Gillis Santa, MD. I verified that I was speaking with the correct person using two identifiers (Name: Blayke Pinera, and date of birth: 1945-08-25).  This visit was completed via telephone due to the restrictions of the COVID-19 pandemic. All issues as above were discussed and addressed but no physical exam was performed. If it was felt that the patient should be evaluated in the office, they were directed there. The patient verbally consented to this visit. Patient was unable to complete an audio/visual visit due to Technical  difficulties and/or Lack of internet. Due to the catastrophic nature of the COVID-19 pandemic, this visit was done through audio contact only.  Location of the patient: home address (see Epic for details)  Location of the provider: office Consent I sought verbal advanced consent from [name] for virtual visit interactions. I informed Ms. Phetteplace of possible security and privacy concerns, risks, and limitations associated with providing "not-in-person" medical evaluation and management services. I also informed Ms. Fosse of the availability of "in-person" appointments. Finally, I informed her that there would be a charge for the virtual visit and that she could be  personally, fully or partially, financially responsible for it. Ms. Kalish expressed understanding and agreed to proceed.   Historic Elements   Ms. Chanika Byland is a 74 y.o. year old, female patient evaluated today after her last contact with our practice on 07/22/2019. Ms. Laprade  has a past medical history of Allergy, Anxiety, Arthritis, Cancer ([place]) (09/2017), Hyperlipidemia, and Hypertension. She also  has a past surgical history that includes Joint replacement; Abdominal hysterectomy; and Wrist surgery. Ms. Krenz has a current medication list which includes the following prescription(s): atorvastatin, baclofen, calcium-magnesium-zinc, gabapentin, hydrochlorothiazide, [START ON 08/17/2019] hydrocodone-acetaminophen, [START ON 09/16/2019] hydrocodone-acetaminophen, [START ON 10/16/2019] hydrocodone-acetaminophen, losartan, naproxen, omeprazole, valsartan-hydrochlorothiazide, valsartan-hydrochlorothiazide, and omeprazole. She  reports that she quit smoking about 12 years ago. Her smoking use included cigarettes. She has never used smokeless tobacco. She reports that she does not drink alcohol or use drugs. Ms. Noah is allergic to tape; zoster vaccine live; and seasonal ic [cholestatin].   HPI  Today, she is being  contacted for medication management.   Having abdominal hernia surgery next week.  Other than that, patient's pain is at baseline.  Patient  continues multimodal pain regimen as prescribed.  States that it provides pain relief and improvement in functional status.  Pharmacotherapy Assessment  Analgesic: Hydrocodone 7.5 mg 3 times daily PRN, quantity 90/month, MME equals 22.5   Monitoring: [place] PMP: PDMP reviewed during this encounter.       Pharmacotherapy: No side-effects or adverse reactions reported. Compliance: No problems identified. Effectiveness: Clinically acceptable. Plan: Refer to "POC".  UDS:  Summary  Date Value Ref Range Status  02/27/2018 FINAL  Final    Comment:    ==================================================================== TOXASSURE SELECT 13 (MW) ==================================================================== Test                             Result       Flag       Units Drug Present and Declared for Prescription Verification   Norhydrocodone                 603          EXPECTED   ng/mg creat    Norhydrocodone is an expected metabolite of hydrocodone. Drug Absent but Declared for Prescription Verification   Hydrocodone                    Not Detected UNEXPECTED ng/mg creat    Hydrocodone is almost always present in patients taking this drug    consistently. Absence of hydrocodone could be due to lapse of    time since the last dose or unusual pharmacokinetics (rapid    metabolism). ==================================================================== Test                      Result    Flag   Units      Ref Range   Creatinine              32               mg/dL      >=20 ==================================================================== Declared Medications:  The flagging and interpretation on this report are based on the  following declared medications.  Unexpected results may arise from  inaccuracies in the declared medications.  **Note: The  testing scope of this panel includes these medications:  Hydrocodone (Hydrocodone-Acetaminophen)  **Note: The testing scope of this panel does not include following  reported medications:  Acetaminophen (Hydrocodone-Acetaminophen)  Atorvastatin  Baclofen  Calcium  Gabapentin  Hydrochlorothiazide (Valsartan-Hydrochlorothizide)  Losartan (Losartan Potassium)  Magnesium  Naproxen  Omeprazole  Valsartan (Valsartan-Hydrochlorothizide)  Zinc ==================================================================== For clinical consultation, please call [phone]. ====================================================================    Laboratory Chemistry Profile   Renal No results found for: BUN, CREATININE, LABCREA, BCR, GFR, GFRAA, GFRNONAA, LABVMA, EPIRU, EPINEPH24HUR, NOREPRU, NOREPI24HUR, DOPARU, [id number]   Hepatic No results found for: AST, ALT, ALBUMIN, ALKPHOS, HCVAB, AMYLASE, LIPASE, AMMONIA   Electrolytes No results found for: NA, K, CL, CALCIUM, MG, PHOS   Bone No results found for: VD25OH, [id number], [id number], [id number], 25OHVITD1, 25OHVITD2, 25OHVITD3, TESTOFREE, TESTOSTERONE   Inflammation (CRP: Acute Phase) (ESR: Chronic Phase) No results found for: CRP, ESRSEDRATE, LATICACIDVEN     Note: Above Lab results reviewed.   Assessment  The primary encounter diagnosis was Chronic pain syndrome. Diagnoses of History of bilateral knee replacement, Primary osteoarthritis of both knees, Lumbar spondylosis, and Lumbar degenerative disc disease were also pertinent to this visit.  Plan of Care   Ms. Lorenza Shakir has a current medication list which includes the following long-term medication(s): atorvastatin, gabapentin, hydrochlorothiazide, losartan,  omeprazole, valsartan-hydrochlorothiazide, valsartan-hydrochlorothiazide, and omeprazole.  Pharmacotherapy (Medications Ordered): Meds ordered this encounter  Medications  . HYDROcodone-acetaminophen (NORCO) 7.5-325  MG tablet    Sig: Take 1 tablet by mouth every 8 (eight) hours as needed for severe pain. Must last 30 days.    Dispense:  90 tablet    Refill:  0    [place] STOP ACT - Not applicable. Fill one day early if pharmacy is closed on scheduled refill date.  Marland Kitchen HYDROcodone-acetaminophen (NORCO) 7.5-325 MG tablet    Sig: Take 1 tablet by mouth every 8 (eight) hours as needed for severe pain. Must last 30 days.    Dispense:  90 tablet    Refill:  0    [place] STOP ACT - Not applicable. Fill one day early if pharmacy is closed on scheduled refill date.  Marland Kitchen HYDROcodone-acetaminophen (NORCO) 7.5-325 MG tablet    Sig: Take 1 tablet by mouth every 8 (eight) hours as needed for severe pain. Must last 30 days.    Dispense:  90 tablet    Refill:  0    [place] STOP ACT - Not applicable. Fill one day early if pharmacy is closed on scheduled refill date.   Follow-up plan:   Return in about 3 months (around 10/22/2019) for Medication Management, in person (UDS).    Recent Visits Date Type Provider Dept  05/02/19 Office Visit Gillis Santa, MD [facility]  Showing recent visits within past 90 days and meeting all other requirements   Today's Visits Date Type Provider Dept  07/23/19 Office Visit Gillis Santa, MD [facility]  Showing today's visits and meeting all other requirements   Future Appointments No visits were found meeting these conditions.  Showing future appointments within next 90 days and meeting all other requirements   I discussed the assessment and treatment plan with the patient. The patient was provided an opportunity to ask questions and all were answered. The patient agreed with the plan and demonstrated an understanding of the instructions.  Patient advised to call back or seek an in-person evaluation if the symptoms or condition worsens.  Duration of encounter: 25 minutes.  Note by: Gillis Santa, MD Date: 07/23/2019; Time: 2:14 PM

## 2019-07-30 ENCOUNTER — Encounter: Payer: Medicare HMO | Admitting: Student in an Organized Health Care Education/Training Program

## 2019-10-16 ENCOUNTER — Other Ambulatory Visit: Payer: Self-pay

## 2019-10-16 ENCOUNTER — Ambulatory Visit
Payer: Medicare HMO | Attending: Student in an Organized Health Care Education/Training Program | Admitting: Student in an Organized Health Care Education/Training Program

## 2019-10-16 ENCOUNTER — Encounter: Payer: Self-pay | Admitting: Student in an Organized Health Care Education/Training Program

## 2019-10-16 VITALS — BP 126/75 | HR 67 | Temp 98.6°F | Resp 18 | Ht 64.0 in | Wt 220.0 lb

## 2019-10-16 DIAGNOSIS — G894 Chronic pain syndrome: Secondary | ICD-10-CM

## 2019-10-16 DIAGNOSIS — M7918 Myalgia, other site: Secondary | ICD-10-CM

## 2019-10-16 DIAGNOSIS — M5136 Other intervertebral disc degeneration, lumbar region: Secondary | ICD-10-CM | POA: Diagnosis present

## 2019-10-16 DIAGNOSIS — Z96653 Presence of artificial knee joint, bilateral: Secondary | ICD-10-CM

## 2019-10-16 DIAGNOSIS — M170 Bilateral primary osteoarthritis of knee: Secondary | ICD-10-CM | POA: Diagnosis present

## 2019-10-16 DIAGNOSIS — M47816 Spondylosis without myelopathy or radiculopathy, lumbar region: Secondary | ICD-10-CM | POA: Diagnosis present

## 2019-10-16 MED ORDER — BACLOFEN 10 MG PO TABS: 10.0000 mg | ORAL_TABLET | Freq: Two times a day (BID) | ORAL | 5 refills | Status: DC | PRN

## 2019-10-16 MED ORDER — HYDROCODONE-ACETAMINOPHEN 7.5-325 MG PO TABS: 1.0000 | ORAL_TABLET | Freq: Three times a day (TID) | ORAL | 0 refills | Status: AC | PRN

## 2019-10-16 MED ORDER — GABAPENTIN 300 MG PO CAPS: 300.0000 mg | ORAL_CAPSULE | Freq: Three times a day (TID) | ORAL | 5 refills | Status: DC

## 2019-10-16 MED ORDER — HYDROCODONE-ACETAMINOPHEN 7.5-325 MG PO TABS: 1.0000 | ORAL_TABLET | Freq: Three times a day (TID) | ORAL | 0 refills | Status: DC | PRN

## 2019-10-16 NOTE — Progress Notes (Signed)
Nursing Pain Medication Assessment:  Safety precautions to be maintained throughout the outpatient stay will include: orient to surroundings, keep bed in low position, maintain call bell within reach at all times, provide assistance with transfer out of bed and ambulation.  Medication Inspection Compliance: Pill count conducted under aseptic conditions, in front of the patient. Neither the pills nor the bottle was removed from the patient's sight at any time. Once count was completed pills were immediately returned to the patient in their original bottle.  Medication: Hydrocodone/APAP Pill/Patch Count: 14 of 90 pills remain Pill/Patch Appearance: Markings consistent with prescribed medication Bottle Appearance: Standard pharmacy container. Clearly labeled. Filled Date: 06/ 04 / 2021 Last Medication intake:  Today

## 2019-10-16 NOTE — Progress Notes (Signed)
PROVIDER NOTE: Information contained herein reflects review and annotations entered in association with encounter. Interpretation of such information and data should be left to medically-trained personnel. Information provided to patient can be located elsewhere in the medical record under "Patient Instructions". Document created using STT-dictation technology, any transcriptional errors that may result from process are unintentional.    Patient: Cindy Rose  Service Category: E/M  Provider: Gillis Santa, MD  DOB: [birth year]  DOS: 10/16/2019  Specialty: Interventional Pain Management  MRN: [id number]  Setting: Ambulatory outpatient  PCP: Patient, No Pcp Per  Type: Established Patient    Referring Provider: No ref. provider found  Location: Office  Delivery: Face-to-face     HPI  Reason for encounter: [name], a 74 y.o. year old female, is here today for evaluation and management of her Chronic pain syndrome [G89.4]. [name]'s primary complain today is Knee Pain Last encounter: Practice (07/23/2019). My last encounter with her was on 07/23/2019. Pertinent problems: [name] has Primary osteoarthritis of both knees; Chronic pain syndrome; History of bilateral knee replacement; Anxiety state; Myofascial pain; Lumbar spondylosis; Bilateral hearing loss; GERD (gastroesophageal reflux disease); Spondylosis of lumbar region without myelopathy or radiculopathy; and Lumbar degenerative disc disease on their pertinent problem list. Pain Assessment: Severity of Chronic pain is reported as a 6 /10. Location: Knee Right, Left/radiates into shins. Onset: More than a month ago. Quality: Tightness, Pressure. Timing: Constant. Modifying factor(s): medications. Vitals:  height is _0  (1.626 m) and weight is 220 lb (99.8 kg). Her temperature is 98.6 F (37 C). Her blood pressure is 126/75 and her pulse is 67. Her respiration is 18 and oxygen saturation is 97%.   S/p ventral hernia surgery with  mesh in May, is recovering from surgery Also had an episode of "sciatica" of the right leg, states that she was out of work for 3-4 weeks. This occurred in January, did receive IM steroid which helped with symptoms. Here today for MM. Patient's pain is at baseline today.  Patient continues multimodal pain regimen as prescribed.  States that it provides pain relief and improvement in functional status.   Pharmacotherapy Assessment   Analgesic: Hydrocodone 7.5 mg 3 times daily PRN, quantity 90/month, MME equals 22.5   Monitoring: [place] PMP: PDMP reviewed during this encounter.       Pharmacotherapy: No side-effects or adverse reactions reported. Compliance: No problems identified. Effectiveness: Clinically acceptable.  UDS:  Summary  Date Value Ref Range Status  02/27/2018 FINAL  Final    Comment:    ==================================================================== TOXASSURE SELECT 13 (MW) ==================================================================== Test                             Result       Flag       Units Drug Present and Declared for Prescription Verification   Norhydrocodone                 603          EXPECTED   ng/mg creat    Norhydrocodone is an expected metabolite of hydrocodone. Drug Absent but Declared for Prescription Verification   Hydrocodone                    Not Detected UNEXPECTED ng/mg creat    Hydrocodone is almost always present in patients taking this drug    consistently. Absence of hydrocodone could be due to lapse of    time  since the last dose or unusual pharmacokinetics (rapid    metabolism). ==================================================================== Test                      Result    Flag   Units      Ref Range   Creatinine              32               mg/dL      >=20 ==================================================================== Declared Medications:  The flagging and interpretation on this report are based on the   following declared medications.  Unexpected results may arise from  inaccuracies in the declared medications.  **Note: The testing scope of this panel includes these medications:  Hydrocodone (Hydrocodone-Acetaminophen)  **Note: The testing scope of this panel does not include following  reported medications:  Acetaminophen (Hydrocodone-Acetaminophen)  Atorvastatin  Baclofen  Calcium  Gabapentin  Hydrochlorothiazide (Valsartan-Hydrochlorothizide)  Losartan (Losartan Potassium)  Magnesium  Naproxen  Omeprazole  Valsartan (Valsartan-Hydrochlorothizide)  Zinc ==================================================================== For clinical consultation, please call [phone]. ====================================================================       ROS  Constitutional: Denies any fever or chills Gastrointestinal: No reported hemesis, hematochezia, vomiting, or acute GI distress Musculoskeletal: Denies any acute onset joint swelling, redness, loss of ROM, or weakness Neurological: No reported episodes of acute onset apraxia, aphasia, dysarthria, agnosia, amnesia, paralysis, loss of coordination, or loss of consciousness  Medication Review  Calcium-Magnesium-Zinc, HYDROcodone-acetaminophen, baclofen, gabapentin, hydrochlorothiazide, losartan, naproxen, omeprazole, pravastatin, and valsartan-hydrochlorothiazide  History Review  Allergy: [name] is allergic to tape, zoster vaccine live, and seasonal ic [cholestatin]. Drug: [name]  reports no history of drug use. Alcohol:  reports no history of alcohol use. Tobacco:  reports that she quit smoking about 12 years ago. Her smoking use included cigarettes. She has never used smokeless tobacco. Social: [name]  reports that she quit smoking about 12 years ago. Her smoking use included cigarettes. She has never used smokeless tobacco. She reports that she does not drink alcohol and does not use drugs. Medical:  has  a past medical history of Allergy, Anxiety, Arthritis, Cancer ([place]) (09/2017), Hyperlipidemia, and Hypertension. Surgical: [name]  has a past surgical history that includes Joint replacement; Abdominal hysterectomy; Wrist surgery; and Hernia repair (2021). Family: family history includes Cancer in her father; Heart disease in her mother.  Laboratory Chemistry Profile   Renal No results found for: BUN, CREATININE, LABCREA, BCR, GFR, GFRAA, GFRNONAA, LABVMA, EPIRU, EPINEPH24HUR, NOREPRU, NOREPI24HUR, DOPARU, [id number]   Hepatic No results found for: AST, ALT, ALBUMIN, ALKPHOS, HCVAB, AMYLASE, LIPASE, AMMONIA   Electrolytes No results found for: NA, K, CL, CALCIUM, MG, PHOS   Bone No results found for: VD25OH, [id number], [id number], [id number], 25OHVITD1, 25OHVITD2, 25OHVITD3, TESTOFREE, TESTOSTERONE   Inflammation (CRP: Acute Phase) (ESR: Chronic Phase) No results found for: CRP, ESRSEDRATE, LATICACIDVEN     Note: Above Lab results reviewed.  Recent Imaging Review  DG C-Arm 1-60 Min-No Report Fluoroscopy was utilized by the requesting physician.  No radiographic  interpretation.  Note: Reviewed        Physical Exam  General appearance: Well nourished, well developed, and well hydrated. In no apparent acute distress Mental status: Alert, oriented x 3 (person, place, & time)       Respiratory: No evidence of acute respiratory distress Eyes: PERLA Vitals: BP 126/75   Pulse 67   Temp 98.6 F (37 C)   Resp 18   Ht _0  (1.626 m)  Wt 220 lb (99.8 kg)   SpO2 97%   BMI 37.76 kg/m  BMI: Estimated body mass index is 37.76 kg/m as calculated from the following:   Height as of this encounter: _0  (1.626 m).   Weight as of this encounter: 220 lb (99.8 kg). Ideal: Ideal body weight: 54.7 kg (120 lb 9.5 oz) Adjusted ideal body weight: 72.7 kg (160 lb 5.7 oz)  Lumbar Spine Area Exam  Skin & Axial Inspection: No masses, redness, or swelling Alignment:  Symmetrical Functional ROM: Pain restricted ROM       Stability: No instability detected Muscle Tone/Strength: Functionally intact. No obvious neuro-muscular anomalies detected. Sensory (Neurological): Musculoskeletal pain pattern Palpation: Complains of area being tender to palpation        Gait & Posture Assessment  Ambulation: Unassisted Gait: Relatively normal for age and body habitus Posture: WNL  Lower Extremity Exam    Side: Right lower extremity  Side: Left lower extremity  Stability: No instability observed          Stability: No instability observed          Skin & Extremity Inspection: Skin color, temperature, and hair growth are WNL. No peripheral edema or cyanosis. No masses, redness, swelling, asymmetry, or associated skin lesions. No contractures.  Skin & Extremity Inspection: Skin color, temperature, and hair growth are WNL. No peripheral edema or cyanosis. No masses, redness, swelling, asymmetry, or associated skin lesions. No contractures.  Functional ROM: Pain restricted ROM for knee joint          Functional ROM: Pain restricted ROM for knee joint          Muscle Tone/Strength: Functionally intact. No obvious neuro-muscular anomalies detected.  Muscle Tone/Strength: Functionally intact. No obvious neuro-muscular anomalies detected.  Sensory (Neurological): Arthropathic arthralgia        Sensory (Neurological): Arthropathic arthralgia        DTR: Patellar: deferred today Achilles: deferred today Plantar: deferred today  DTR: Patellar: deferred today Achilles: deferred today Plantar: deferred today  Palpation: No palpable anomalies  Palpation: No palpable anomalies    Assessment   Status Diagnosis  Controlled Controlled Controlled 1. Chronic pain syndrome   2. History of bilateral knee replacement   3. Primary osteoarthritis of both knees   4. Lumbar spondylosis   5. Lumbar degenerative disc disease   6. Myofascial pain      Updated Problems: Problem   Lumbar Degenerative Disc Disease  Primary Osteoarthritis of Both Knees  Chronic Pain Syndrome  History of Bilateral Knee Replacement  Anxiety State  Myofascial Pain  Lumbar Spondylosis  Gerd (Gastroesophageal Reflux Disease)  Spondylosis of Lumbar Region Without Myelopathy Or Radiculopathy  Bilateral Hearing Loss  Essential Hypertension  Hypercholesterolemia    Plan of Care   [name] has a current medication list which includes the following long-term medication(s): gabapentin, hydrochlorothiazide, losartan, omeprazole, pravastatin, and valsartan-hydrochlorothiazide.  Pharmacotherapy (Medications Ordered): Meds ordered this encounter  Medications  . HYDROcodone-acetaminophen (NORCO) 7.5-325 MG tablet    Sig: Take 1 tablet by mouth every 8 (eight) hours as needed for severe pain. Must last 30 days.    Dispense:  90 tablet    Refill:  0    [place] STOP ACT - Not applicable. Fill one day early if pharmacy is closed on scheduled refill date.  Marland Kitchen HYDROcodone-acetaminophen (NORCO) 7.5-325 MG tablet    Sig: Take 1 tablet by mouth every 8 (eight) hours as needed for severe pain. Must last 30 days.  Dispense:  90 tablet    Refill:  0    [place] STOP ACT - Not applicable. Fill one day early if pharmacy is closed on scheduled refill date.  Marland Kitchen HYDROcodone-acetaminophen (NORCO) 7.5-325 MG tablet    Sig: Take 1 tablet by mouth every 8 (eight) hours as needed for severe pain. Must last 30 days.    Dispense:  90 tablet    Refill:  0    [place] STOP ACT - Not applicable. Fill one day early if pharmacy is closed on scheduled refill date.  . gabapentin (NEURONTIN) 300 MG capsule    Sig: Take 1 capsule (300 mg total) by mouth 3 (three) times daily.    Dispense:  90 capsule    Refill:  5  . baclofen (LIORESAL) 10 MG tablet    Sig: Take 1 tablet (10 mg total) by mouth 2 (two) times daily as needed for muscle spasms.    Dispense:  60 tablet    Refill:  5    Do not place this medication, or any  other prescription from our practice, on "Automatic Refill". Patient may have prescription filled one day early if pharmacy is closed on scheduled refill date.   Orders:  Orders Placed This Encounter  Procedures  . ToxASSURE Select 13 (MW), Urine    Volume: 30 ml(s). Minimum 3 ml of urine is needed. Document temperature of fresh sample. Indications: Long term (current) use of opiate analgesic [phone])    Order Specific Question:   Release to patient    Answer:   Immediate   Follow-up plan:   Return in about 4 months (around 02/06/2020) for Medication Management, in person.   Recent Visits Date Type Provider Dept  07/23/19 Office Visit Gillis Santa, MD [facility]  Showing recent visits within past 90 days and meeting all other requirements Today's Visits Date Type Provider Dept  10/16/19 Office Visit Gillis Santa, MD [facility]  Showing today's visits and meeting all other requirements Future Appointments No visits were found meeting these conditions. Showing future appointments within next 90 days and meeting all other requirements  I discussed the assessment and treatment plan with the patient. The patient was provided an opportunity to ask questions and all were answered. The patient agreed with the plan and demonstrated an understanding of the instructions.  Patient advised to call back or seek an in-person evaluation if the symptoms or condition worsens.  Duration of encounter: 60mnutes.  Note by: BGillis Santa MD Date: 10/16/2019; Time: 1:40 PM

## 2019-10-22 ENCOUNTER — Encounter: Payer: Medicare HMO | Admitting: Student in an Organized Health Care Education/Training Program

## 2019-10-23 LAB — TOXASSURE SELECT 13 (MW), URINE

## 2020-02-06 ENCOUNTER — Encounter: Payer: Self-pay | Admitting: Student in an Organized Health Care Education/Training Program

## 2020-02-06 ENCOUNTER — Ambulatory Visit
Payer: Medicare HMO | Attending: Student in an Organized Health Care Education/Training Program | Admitting: Student in an Organized Health Care Education/Training Program

## 2020-02-06 ENCOUNTER — Other Ambulatory Visit: Payer: Self-pay

## 2020-02-06 VITALS — BP 140/79 | HR 82 | Temp 97.3°F | Resp 18 | Ht 64.0 in | Wt 220.0 lb

## 2020-02-06 DIAGNOSIS — Z96653 Presence of artificial knee joint, bilateral: Secondary | ICD-10-CM | POA: Diagnosis not present

## 2020-02-06 DIAGNOSIS — M47816 Spondylosis without myelopathy or radiculopathy, lumbar region: Secondary | ICD-10-CM | POA: Diagnosis not present

## 2020-02-06 DIAGNOSIS — M7918 Myalgia, other site: Secondary | ICD-10-CM | POA: Diagnosis present

## 2020-02-06 DIAGNOSIS — M5136 Other intervertebral disc degeneration, lumbar region: Secondary | ICD-10-CM | POA: Diagnosis present

## 2020-02-06 DIAGNOSIS — C182 Malignant neoplasm of ascending colon: Secondary | ICD-10-CM | POA: Insufficient documentation

## 2020-02-06 DIAGNOSIS — M170 Bilateral primary osteoarthritis of knee: Secondary | ICD-10-CM | POA: Insufficient documentation

## 2020-02-06 DIAGNOSIS — G894 Chronic pain syndrome: Secondary | ICD-10-CM | POA: Diagnosis present

## 2020-02-06 MED ORDER — HYDROCODONE-ACETAMINOPHEN 7.5-325 MG PO TABS: 1.0000 | ORAL_TABLET | Freq: Three times a day (TID) | ORAL | 0 refills | Status: DC | PRN

## 2020-02-06 MED ORDER — HYDROCODONE-ACETAMINOPHEN 7.5-325 MG PO TABS: 1.0000 | ORAL_TABLET | Freq: Three times a day (TID) | ORAL | 0 refills | Status: AC | PRN

## 2020-02-06 NOTE — Progress Notes (Signed)
PROVIDER NOTE: Information contained herein reflects review and annotations entered in association with encounter. Interpretation of such information and data should be left to medically-trained personnel. Information provided to patient can be located elsewhere in the medical record under "Patient Instructions". Document created using STT-dictation technology, any transcriptional errors that may result from process are unintentional.    Patient: Cindy Rose  Service Category: E/M  Provider: Gillis Santa, MD  DOB: 01-01-1946  DOS: 02/06/2020  Specialty: Interventional Pain Management  MRN: [id number]  Setting: Ambulatory outpatient  PCP: Patient, No Pcp Per  Type: Established Patient    Referring Provider: No ref. provider found  Location: Office  Delivery: Face-to-face     HPI  [name], a 74 y.o. year old female, is here today because of her Chronic pain syndrome [G89.4]. [name]'s primary complain today is Leg Pain (bilateral, knees and shins) Last encounter: My last encounter with her was on 10/16/2019. Pertinent problems: [name] has Primary osteoarthritis of both knees; Chronic pain syndrome; History of bilateral knee replacement; Anxiety state; Myofascial pain; Lumbar spondylosis; Bilateral hearing loss; GERD (gastroesophageal reflux disease); Spondylosis of lumbar region without myelopathy or radiculopathy; and Lumbar degenerative disc disease on their pertinent problem list. Pain Assessment: Severity of Chronic pain is reported as a 7 /10. Location: Leg Right, Left/radiates from knees to shins. Onset: More than a month ago. Quality: Burning, Tightness, Squeezing. Timing: Intermittent. Modifying factor(s): voltaren gel, resting. Vitals:  height is 5' 4"  (1.626 m) and weight is 220 lb (99.8 kg). Her temperature is 97.3 F (36.3 C) (abnormal). Her blood pressure is 140/79 and her pulse is 82. Her respiration is 18 and oxygen saturation is 98%.   Reason for encounter:  medication management.   No change in medical history since last visit.  Patient's pain is at baseline.  Patient continues multimodal pain regimen as prescribed.  States that it provides pain relief and improvement in functional status.Did see Oncology, Dr Cindy Rose given colon Ca resection 2019. Per Dr Cindy Rose : Assuming her scans and CEA are unremarkable, I would just plan to see her back every 6 months with labs to include a CEA and get CT scans once a year.   Pharmacotherapy Assessment   Analgesic: Hydrocodone 7.5 mg 3 times daily PRN, quantity 90/month, MME equals 22.5   Monitoring: [place] PMP: PDMP reviewed during this encounter.       Pharmacotherapy: No side-effects or adverse reactions reported. Compliance: No problems identified. Effectiveness: Clinically acceptable.  Dewayne Shorter, RN  02/06/2020  1:57 PM  Signed Nursing Pain Medication Assessment:  Safety precautions to be maintained throughout the outpatient stay will include: orient to surroundings, keep bed in low position, maintain call bell within reach at all times, provide assistance with transfer out of bed and ambulation.  Medication Inspection Compliance: Pill count conducted under aseptic conditions, in front of the patient. Neither the pills nor the bottle was removed from the patient's sight at any time. Once count was completed pills were immediately returned to the patient in their original bottle.  Medication: Hydrocodone/APAP Pill/Patch Count: 54 of 90 pills remain Pill/Patch Appearance: Markings consistent with prescribed medication Bottle Appearance: Standard pharmacy container. Clearly labeled. Filled Date: 10 / 05/ 2021 Last Medication intake:  Today    UDS:  Summary  Date Value Ref Range Status  10/16/2019 Note  Corrected    Comment:    ==================================================================== [facility] (MW) ==================================================================== Test  Result       Flag       Units  Drug Present and Declared for Prescription Verification   Hydrocodone                    833          EXPECTED   ng/mg creat   Norhydrocodone                 1241         EXPECTED   ng/mg creat    Sources of hydrocodone include scheduled prescription medications.    Norhydrocodone is an expected metabolite of hydrocodone.  Drug Present not Declared for Prescription Verification   Oxazepam                       50           UNEXPECTED ng/mg creat    Oxazepam may be administered as a scheduled prescription medication;    it is also an expected metabolite of other benzodiazepine drugs,    including diazepam, chlordiazepoxide, prazepam, clorazepate,    halazepam, and temazepam.  ==================================================================== Test                      Result    Flag   Units      Ref Range   Creatinine              46               mg/dL      >=20 ==================================================================== Declared Medications:  The flagging and interpretation on this report are based on the  following declared medications.  Unexpected results may arise from  inaccuracies in the declared medications.   **Note: The testing scope of this panel includes these medications:   Hydrocodone   **Note: The testing scope of this panel does not include the  following reported medications:   Acetaminophen  Baclofen  Calcium  Gabapentin  Hydrochlorothiazide  Losartan (Cozaar)  Magnesium  Naproxen  Omeprazole  Pravastatin  Valsartan  Zinc ==================================================================== For clinical consultation, please call [phone]. ====================================================================      ROS  Constitutional: Denies any fever or chills Gastrointestinal: No reported hemesis, hematochezia, vomiting, or acute GI distress Musculoskeletal: b/l knee  pain Neurological: No reported episodes of acute onset apraxia, aphasia, dysarthria, agnosia, amnesia, paralysis, loss of coordination, or loss of consciousness  Medication Review  Calcium-Magnesium-Zinc, HYDROcodone-acetaminophen, baclofen, gabapentin, hydrochlorothiazide, losartan, naproxen, omeprazole, pravastatin, and valsartan-hydrochlorothiazide  History Review  Allergy: Ms. Harroun is allergic to other, tape, zoster vaccine live, and seasonal ic [cholestatin]. Drug: Ms. Arai  reports no history of drug use. Alcohol:  reports no history of alcohol use. Tobacco:  reports that she quit smoking about 13 years ago. Her smoking use included cigarettes. She has never used smokeless tobacco. Social: Ms. Dorval  reports that she quit smoking about 13 years ago. Her smoking use included cigarettes. She has never used smokeless tobacco. She reports that she does not drink alcohol and does not use drugs. Medical:  has a past medical history of Allergy, Anxiety, Arthritis, Cancer ([place]) (09/2017), Hyperlipidemia, and Hypertension. Surgical: Ms. Beamon  has a past surgical history that includes Joint replacement; Abdominal hysterectomy; Wrist surgery; and Hernia repair (2021). Family: family history includes Cancer in her father; Heart disease in her mother.  Laboratory Chemistry Profile   Renal No results found for: BUN, CREATININE, LABCREA, BCR, GFR,  GFRAA, GFRNONAA, LABVMA, EPIRU, [id number], NOREPRU, NOREPI24HUR, DOPARU, [id number]   Hepatic No results found for: AST, ALT, ALBUMIN, ALKPHOS, HCVAB, AMYLASE, LIPASE, AMMONIA   Electrolytes No results found for: NA, K, CL, CALCIUM, MG, PHOS   Bone No results found for: VD25OH, [id number], [id number], [id number], 25OHVITD1, 25OHVITD2, 25OHVITD3, TESTOFREE, TESTOSTERONE   Inflammation (CRP: Acute Phase) (ESR: Chronic Phase) No results found for: CRP, ESRSEDRATE, LATICACIDVEN     Note: Above Lab results reviewed.   Physical Exam   General appearance: Well nourished, well developed, and well hydrated. In no apparent acute distress Mental status: Alert, oriented x 3 (person, place, & time)       Respiratory: No evidence of acute respiratory distress Eyes: PERLA Vitals: BP 140/79   Pulse 82   Temp (!) 97.3 F (36.3 C)   Resp 18   Ht 5' 4"  (1.626 m)   Wt 220 lb (99.8 kg)   SpO2 98%   BMI 37.76 kg/m  BMI: Estimated body mass index is 37.76 kg/m as calculated from the following:   Height as of this encounter: 5' 4"  (1.626 m).   Weight as of this encounter: 220 lb (99.8 kg). Ideal: Ideal body weight: 54.7 kg (120 lb 9.5 oz) Adjusted ideal body weight: 72.7 kg (160 lb 5.7 oz)   Lumbar Spine Area Exam  Skin & Axial Inspection: No masses, redness, or swelling Alignment: Symmetrical Functional ROM: Pain restricted ROM       Stability: No instability detected Muscle Tone/Strength: Functionally intact. No obvious neuro-muscular anomalies detected. Sensory (Neurological): Musculoskeletal pain pattern Palpation: Complains of area being tender to palpation        Gait & Posture Assessment  Ambulation: Unassisted Gait: Relatively normal for age and body habitus Posture: WNL  Lower Extremity Exam    Side: Right lower extremity  Side: Left lower extremity  Stability: No instability observed          Stability: No instability observed          Skin & Extremity Inspection: Skin color, temperature, and hair growth are WNL. No peripheral edema or cyanosis. No masses, redness, swelling, asymmetry, or associated skin lesions. No contractures.  Skin & Extremity Inspection: Skin color, temperature, and hair growth are WNL. No peripheral edema or cyanosis. No masses, redness, swelling, asymmetry, or associated skin lesions. No contractures.  Functional ROM: Pain restricted ROM for knee joint          Functional ROM: Pain restricted ROM for knee joint          Muscle Tone/Strength: Functionally intact. No obvious  neuro-muscular anomalies detected.  Muscle Tone/Strength: Functionally intact. No obvious neuro-muscular anomalies detected.  Sensory (Neurological): Arthropathic arthralgia        Sensory (Neurological): Arthropathic arthralgia        DTR: Patellar: deferred today Achilles: deferred today Plantar: deferred today  DTR: Patellar: deferred today Achilles: deferred today Plantar: deferred today  Palpation: No palpable anomalies  Palpation: No palpable anomalies     Assessment   Status Diagnosis  Controlled Controlled Controlled 1. Chronic pain syndrome   2. History of bilateral knee replacement   3. Primary osteoarthritis of both knees   4. Lumbar spondylosis   5. Lumbar degenerative disc disease   6. Myofascial pain   7. Malignant neoplasm of ascending colon [facility])      Plan of Care  Ms. Rosaleah Person has a current medication list which includes the following long-term medication(s): gabapentin, hydrochlorothiazide, losartan, omeprazole, pravastatin, and valsartan-hydrochlorothiazide.  Pharmacotherapy (  Medications Ordered): Meds ordered this encounter  Medications  . HYDROcodone-acetaminophen (NORCO) 7.5-325 MG tablet    Sig: Take 1 tablet by mouth every 8 (eight) hours as needed for severe pain. Must last 30 days.    Dispense:  90 tablet    Refill:  0    [place] STOP ACT - Not applicable. Fill one day early if pharmacy is closed on scheduled refill date.  Marland Kitchen HYDROcodone-acetaminophen (NORCO) 7.5-325 MG tablet    Sig: Take 1 tablet by mouth every 8 (eight) hours as needed for severe pain. Must last 30 days.    Dispense:  90 tablet    Refill:  0    [place] STOP ACT - Not applicable. Fill one day early if pharmacy is closed on scheduled refill date.  Marland Kitchen HYDROcodone-acetaminophen (NORCO) 7.5-325 MG tablet    Sig: Take 1 tablet by mouth every 8 (eight) hours as needed for severe pain. Must last 30 days.    Dispense:  90 tablet    Refill:  0     STOP ACT - Not applicable. Fill  one day early if pharmacy is closed on scheduled refill date.    Follow-up plan:   Return in about 3 months (around 05/08/2020) for Medication Management, in person.   Recent Visits No visits were found meeting these conditions. Showing recent visits within past 90 days and meeting all other requirements Today's Visits Date Type Provider Dept  02/06/20 Office Visit Gillis Santa, MD [facility]  Showing today's visits and meeting all other requirements Future Appointments No visits were found meeting these conditions. Showing future appointments within next 90 days and meeting all other requirements  I discussed the assessment and treatment plan with the patient. The patient was provided an opportunity to ask questions and all were answered. The patient agreed with the plan and demonstrated an understanding of the instructions.  Patient advised to call back or seek an in-person evaluation if the symptoms or condition worsens.  Duration of encounter: 70mnutes.  Note by: BGillis Santa MD Date: 02/06/2020; Time: 2:12 PM

## 2020-02-06 NOTE — Progress Notes (Signed)
Nursing Pain Medication Assessment:  Safety precautions to be maintained throughout the outpatient stay will include: orient to surroundings, keep bed in low position, maintain call bell within reach at all times, provide assistance with transfer out of bed and ambulation.  Medication Inspection Compliance: Pill count conducted under aseptic conditions, in front of the patient. Neither the pills nor the bottle was removed from the patient's sight at any time. Once count was completed pills were immediately returned to the patient in their original bottle.  Medication: Hydrocodone/APAP Pill/Patch Count: 54 of 90 pills remain Pill/Patch Appearance: Markings consistent with prescribed medication Bottle Appearance: Standard pharmacy container. Clearly labeled. Filled Date: 10 / 05/ 2021 Last Medication intake:  Today

## 2020-02-06 NOTE — Patient Instructions (Signed)
____________________________________________________________________________________________  Medication Rules  Applies to: All patients receiving prescriptions (written or electronic).  Pharmacy of record: Pharmacy where electronic prescriptions will be sent. If written prescriptions are taken to a different pharmacy, please inform the nursing staff. The pharmacy listed in the electronic medical record should be the one where you would like electronic prescriptions to be sent.  Prescription refills: Only during scheduled appointments. Applies to both, written and electronic prescriptions.  NOTE: The following applies primarily to controlled substances (Opioid* Pain Medications).   Patient's responsibilities: 1. Pain Pills: Bring all pain pills to every appointment (except for procedure appointments). 2. Pill Bottles: Bring pills in original pharmacy bottle. Always bring newest bottle. Bring bottle, even if empty. 3. Medication refills: You are responsible for knowing and keeping track of what medications you need refilled. The day before your appointment, write a list of all prescriptions that need to be refilled. Bring that list to your appointment and give it to the admitting nurse. Prescriptions will be written only during appointments. If you forget a medication, it will not be "Called in", "Faxed", or "electronically sent". You will need to get another appointment to get these prescribed. 4. Prescription Accuracy: You are responsible for carefully inspecting your prescriptions before leaving our office. Have the discharge nurse carefully go over each prescription with you, before taking them home. Make sure that your name is accurately spelled, that your address is correct. Check the name and dose of your medication to make sure it is accurate. Check the number of pills, and the written instructions to make sure they are clear and accurate. Make sure that you are given enough medication to last  until your next medication refill appointment. 5. Taking Medication: Take medication as prescribed. Never take more pills than instructed. Never take medication more frequently than prescribed. Taking less pills or less frequently is permitted and encouraged, when it comes to controlled substances (written prescriptions).  6. Inform other Doctors: Always inform, all of your healthcare providers, of all the medications you take. 7. Pain Medication from other Providers: You are not allowed to accept any additional pain medication from any other Doctor or Healthcare provider. There are two exceptions to this rule. (see below) In the event that you require additional pain medication, you are responsible for notifying us, as stated below. 8. Medication Agreement: You are responsible for carefully reading and following our Medication Agreement. This must be signed before receiving any prescriptions from our practice. Safely store a copy of your signed Agreement. Violations to the Agreement will result in no further prescriptions. (Additional copies of our Medication Agreement are available upon request.) 9. Laws, Rules, & Regulations: All patients are expected to follow all Federal and State Laws, Statutes, Rules, & Regulations. Ignorance of the Laws does not constitute a valid excuse. The use of any illegal substances is prohibited. 10. Adopted CDC guidelines & recommendations: Target dosing levels will be at or below 60 MME/day. Use of benzodiazepines** is not recommended.  Exceptions: There are only two exceptions to the rule of not receiving pain medications from other Healthcare Providers. 1. Exception #1 (Emergencies): In the event of an emergency (i.e.: accident requiring emergency care), you are allowed to receive additional pain medication. However, you are responsible for: As soon as you are able, call our office [phone], at any time of the day or night, and leave a message stating your name, the  date and nature of the emergency, and the name and dose of the medication   prescribed. In the event that your call is answered by a member of our staff, make sure to document and save the date, time, and the name of the person that took your information.  2. Exception #2 (Planned Surgery): In the event that you are scheduled by another doctor or dentist to have any type of surgery or procedure, you are allowed (for a period no longer than 30 days), to receive additional pain medication, for the acute post-op pain. However, in this case, you are responsible for picking up a copy of our "Post-op Pain Management for Surgeons" handout, and giving it to your surgeon or dentist. This document is available at our office, and does not require an appointment to obtain it. Simply go to our office during business hours (Monday-Thursday from 8:00 AM to 4:00 PM) (Friday 8:00 AM to 12:00 Noon) or if you have a scheduled appointment with us, prior to your surgery, and ask for it by name. In addition, you will need to provide us with your name, name of your surgeon, type of surgery, and date of procedure or surgery.  *Opioid medications include: morphine, codeine, oxycodone, oxymorphone, hydrocodone, hydromorphone, meperidine, tramadol, tapentadol, buprenorphine, fentanyl, methadone. **Benzodiazepine medications include: diazepam (Valium), alprazolam (Xanax), clonazepam (Klonopine), lorazepam (Ativan), clorazepate (Tranxene), chlordiazepoxide (Librium), estazolam (Prosom), oxazepam (Serax), temazepam (Restoril), triazolam (Halcion) (Last updated: 06/15/2017) ____________________________________________________________________________________________    

## 2020-05-07 ENCOUNTER — Encounter: Payer: Medicare HMO | Admitting: Student in an Organized Health Care Education/Training Program

## 2020-05-13 ENCOUNTER — Other Ambulatory Visit: Payer: Self-pay

## 2020-05-13 ENCOUNTER — Telehealth: Payer: Self-pay | Admitting: *Deleted

## 2020-05-13 ENCOUNTER — Encounter: Payer: Self-pay | Admitting: Student in an Organized Health Care Education/Training Program

## 2020-05-13 ENCOUNTER — Ambulatory Visit
Payer: Medicare HMO | Attending: Student in an Organized Health Care Education/Training Program | Admitting: Student in an Organized Health Care Education/Training Program

## 2020-05-13 DIAGNOSIS — Z96653 Presence of artificial knee joint, bilateral: Secondary | ICD-10-CM

## 2020-05-13 DIAGNOSIS — M5136 Other intervertebral disc degeneration, lumbar region: Secondary | ICD-10-CM

## 2020-05-13 DIAGNOSIS — M170 Bilateral primary osteoarthritis of knee: Secondary | ICD-10-CM

## 2020-05-13 DIAGNOSIS — C182 Malignant neoplasm of ascending colon: Secondary | ICD-10-CM

## 2020-05-13 DIAGNOSIS — M47816 Spondylosis without myelopathy or radiculopathy, lumbar region: Secondary | ICD-10-CM

## 2020-05-13 DIAGNOSIS — G894 Chronic pain syndrome: Secondary | ICD-10-CM | POA: Diagnosis not present

## 2020-05-13 DIAGNOSIS — M7918 Myalgia, other site: Secondary | ICD-10-CM

## 2020-05-13 MED ORDER — HYDROCODONE-ACETAMINOPHEN 7.5-325 MG PO TABS: 1.0000 | ORAL_TABLET | Freq: Three times a day (TID) | ORAL | 0 refills | Status: AC | PRN

## 2020-05-13 MED ORDER — GABAPENTIN 300 MG PO CAPS: 300.0000 mg | ORAL_CAPSULE | Freq: Three times a day (TID) | ORAL | 5 refills | Status: DC

## 2020-05-13 MED ORDER — HYDROCODONE-ACETAMINOPHEN 7.5-325 MG PO TABS: 1.0000 | ORAL_TABLET | Freq: Three times a day (TID) | ORAL | 0 refills | Status: DC | PRN

## 2020-05-13 NOTE — Progress Notes (Signed)
Patient: Cindy Rose  Service Category: E/M  Provider: Gillis Santa, MD  DOB: [birth year]  DOS: 05/13/2020  Location: Office  MRN: [id number]  Setting: Ambulatory outpatient  Referring Provider: No ref. provider found  Type: Established Patient  Specialty: Interventional Pain Management  PCP: Patient, No Pcp Per  Location: Home  Delivery: TeleHealth     Virtual Encounter - Pain Management PROVIDER NOTE: Information contained herein reflects review and annotations entered in association with encounter. Interpretation of such information and data should be left to medically-trained personnel. Information provided to patient can be located elsewhere in the medical record under "Patient Instructions". Document created using STT-dictation technology, any transcriptional errors that may result from process are unintentional.    Contact & Pharmacy Preferred: [phone] Home: [phone] (home) Mobile: [phone] (mobile) E-mail: No e-mail address on record  [place], [place] - [place] [address] [place] [address] Phone: [phone] Fax: [phone]   Pre-screening  Ms. Midkiff offered "in-person" vs "virtual" encounter. She indicated preferring virtual for this encounter.   Reason COVID-19*  Social distancing based on CDC and AMA recommendations.   I contacted Nelsie Domino on 05/13/2020 via video conference.      I clearly identified myself as Gillis Santa, MD. I verified that I was speaking with the correct person using two identifiers (Name: Collyn Selk, and date of birth: December 01, 1945).  Consent I sought verbal advanced consent from [name] for virtual visit interactions. I informed Ms. Pfahler of possible security and privacy concerns, risks, and limitations associated with providing "not-in-person" medical evaluation and management services. I also informed Ms. Fillion of the availability of "in-person" appointments. Finally, I informed her  that there would be a charge for the virtual visit and that she could be  personally, fully or partially, financially responsible for it. Ms. Wigfall expressed understanding and agreed to proceed.   Historic Elements   Ms. Lynetta Tomczak is a 75 y.o. year old, female patient evaluated today after our last contact on 02/06/2020. Ms. Barnaby  has a past medical history of Allergy, Anxiety, Arthritis, Cancer ([place]) (09/2017), Hyperlipidemia, and Hypertension. She also  has a past surgical history that includes Joint replacement; Abdominal hysterectomy; Wrist surgery; and Hernia repair (2021). Ms. Bergeman has a current medication list which includes the following prescription(s): baclofen, calcium-magnesium-zinc, gabapentin, hydrochlorothiazide, [START ON 05/31/2020] hydrocodone-acetaminophen, [START ON 06/30/2020] hydrocodone-acetaminophen, losartan, naproxen, omeprazole, pravastatin, and valsartan-hydrochlorothiazide. She  reports that she quit smoking about 13 years ago. Her smoking use included cigarettes. She has never used smokeless tobacco. She reports that she does not drink alcohol and does not use drugs. Ms. Sperl is allergic to other, tape, zoster vaccine live, and seasonal ic [cholestatin].   HPI  Today, she is being contacted for medication management.   No change in medical history since last visit.  Patient's pain is at baseline.  Patient continues multimodal pain regimen as prescribed.  States that it provides pain relief and improvement in functional status.  05/01/2020  02/06/2020   1  Hydrocodone-Acetamin 7.5-325  90.00  30  [place]  [id number]  [facility] (7248)  0/0  22.50 MME  Medicare  [place]     Pharmacotherapy Assessment  Analgesic: Hydrocodone 7.5 mg 3 times daily PRN, quantity 90/month, MME equals 22.5   Monitoring: [place] PMP: PDMP reviewed during this encounter.       Pharmacotherapy: No side-effects or adverse reactions reported. Compliance: No problems identified. Effectiveness:  Clinically acceptable. Plan: Refer to "POC".  UDS:  Summary  Date Value Ref Range Status  10/16/2019 Note  Corrected    Comment:    ==================================================================== [employer] Select 13 (MW) ==================================================================== Test                             Result       Flag       Units  Drug Present and Declared for Prescription Verification   Hydrocodone                    833          EXPECTED   ng/mg creat   Norhydrocodone                 1241         EXPECTED   ng/mg creat    Sources of hydrocodone include scheduled prescription medications.    Norhydrocodone is an expected metabolite of hydrocodone.  Drug Present not Declared for Prescription Verification   Oxazepam                       50           UNEXPECTED ng/mg creat    Oxazepam may be administered as a scheduled prescription medication;    it is also an expected metabolite of other benzodiazepine drugs,    including diazepam, chlordiazepoxide, prazepam, clorazepate,    halazepam, and temazepam.  ==================================================================== Test                      Result    Flag   Units      Ref Range   Creatinine              46               mg/dL      >=20 ==================================================================== Declared Medications:  The flagging and interpretation on this report are based on the  following declared medications.  Unexpected results may arise from  inaccuracies in the declared medications.   **Note: The testing scope of this panel includes these medications:   Hydrocodone   **Note: The testing scope of this panel does not include the  following reported medications:   Acetaminophen  Baclofen  Calcium  Gabapentin  Hydrochlorothiazide  Losartan (Cozaar)  Magnesium  Naproxen  Omeprazole  Pravastatin  Valsartan   Zinc ==================================================================== For clinical consultation, please call [phone]. ====================================================================      Assessment  The primary encounter diagnosis was Chronic pain syndrome. Diagnoses of Primary osteoarthritis of both knees, Lumbar spondylosis, History of bilateral knee replacement, Lumbar degenerative disc disease, Myofascial pain, and Malignant neoplasm of ascending colon ([place]) were also pertinent to this visit.  Plan of Care  Ms. Tawni Melkonian has a current medication list which includes the following long-term medication(s): gabapentin, hydrochlorothiazide, losartan, omeprazole, pravastatin, and valsartan-hydrochlorothiazide.  Pharmacotherapy (Medications Ordered): Meds ordered this encounter  Medications  . HYDROcodone-acetaminophen (NORCO) 7.5-325 MG tablet    Sig: Take 1 tablet by mouth every 8 (eight) hours as needed for severe pain. Must last 30 days.    Dispense:  90 tablet    Refill:  0    [place] STOP ACT - Not applicable. Fill one day early if pharmacy is closed on scheduled refill date.  [place] HYDROcodone-acetaminophen (NORCO) 7.5-325 MG tablet    Sig: Take 1 tablet by mouth every 8 (eight) hours as needed for severe  pain. Must last 30 days.    Dispense:  90 tablet    Refill:  0    [place] STOP ACT - Not applicable. Fill one day early if pharmacy is closed on scheduled refill date.  . gabapentin (NEURONTIN) 300 MG capsule    Sig: Take 1 capsule (300 mg total) by mouth 3 (three) times daily.    Dispense:  90 capsule    Refill:  5   Follow-up plan:   Return in about 2 months (around 07/23/2020) for Medication Management, in person.   Recent Visits No visits were found meeting these conditions. Showing recent visits within past 90 days and meeting all other requirements Today's Visits Date Type Provider Dept  05/13/20 Telemedicine Gillis Santa, MD [facility]  Showing  today's visits and meeting all other requirements Future Appointments No visits were found meeting these conditions. Showing future appointments within next 90 days and meeting all other requirements  I discussed the assessment and treatment plan with the patient. The patient was provided an opportunity to ask questions and all were answered. The patient agreed with the plan and demonstrated an understanding of the instructions.  Patient advised to call back or seek an in-person evaluation if the symptoms or condition worsens.  Duration of encounter:30 minutes.  Note by: Gillis Santa, MD Date: 05/13/2020; Time: 2:38 PM

## 2020-05-13 NOTE — Telephone Encounter (Signed)
Called and LVM to return call for pre visit assessment.

## 2020-06-09 ENCOUNTER — Other Ambulatory Visit: Payer: Self-pay | Admitting: Student in an Organized Health Care Education/Training Program

## 2020-06-09 DIAGNOSIS — G894 Chronic pain syndrome: Secondary | ICD-10-CM

## 2020-07-21 ENCOUNTER — Telehealth: Payer: Self-pay

## 2020-07-21 ENCOUNTER — Other Ambulatory Visit: Payer: Self-pay

## 2020-07-21 ENCOUNTER — Ambulatory Visit
Payer: Medicare HMO | Attending: Student in an Organized Health Care Education/Training Program | Admitting: Student in an Organized Health Care Education/Training Program

## 2020-07-21 ENCOUNTER — Encounter: Payer: Self-pay | Admitting: Student in an Organized Health Care Education/Training Program

## 2020-07-21 ENCOUNTER — Encounter: Payer: Medicare HMO | Admitting: Student in an Organized Health Care Education/Training Program

## 2020-07-21 DIAGNOSIS — M47816 Spondylosis without myelopathy or radiculopathy, lumbar region: Secondary | ICD-10-CM

## 2020-07-21 DIAGNOSIS — M7918 Myalgia, other site: Secondary | ICD-10-CM

## 2020-07-21 DIAGNOSIS — M170 Bilateral primary osteoarthritis of knee: Secondary | ICD-10-CM

## 2020-07-21 DIAGNOSIS — Z96653 Presence of artificial knee joint, bilateral: Secondary | ICD-10-CM

## 2020-07-21 DIAGNOSIS — G894 Chronic pain syndrome: Secondary | ICD-10-CM

## 2020-07-21 DIAGNOSIS — M5136 Other intervertebral disc degeneration, lumbar region: Secondary | ICD-10-CM

## 2020-07-21 MED ORDER — HYDROCODONE-ACETAMINOPHEN 7.5-325 MG PO TABS: 1.0000 | ORAL_TABLET | Freq: Three times a day (TID) | ORAL | 0 refills | Status: DC | PRN

## 2020-07-21 MED ORDER — HYDROCODONE-ACETAMINOPHEN 7.5-325 MG PO TABS: 1.0000 | ORAL_TABLET | Freq: Three times a day (TID) | ORAL | 0 refills | Status: AC | PRN

## 2020-07-21 MED ORDER — GABAPENTIN 300 MG PO CAPS: 300.0000 mg | ORAL_CAPSULE | Freq: Three times a day (TID) | ORAL | 5 refills | Status: DC

## 2020-07-21 NOTE — Telephone Encounter (Signed)
Left message for patient to call us back for pre virtual appointment

## 2020-07-21 NOTE — Progress Notes (Signed)
Patient: Cindy Rose  Service Category: E/M  Provider: Gillis Santa, MD  DOB: [birth year]  DOS: 07/21/2020  Location: Office  MRN: [id number]  Setting: Ambulatory outpatient  Referring Provider: No ref. provider found  Type: Established Patient  Specialty: Interventional Pain Management  PCP: Patient, No Pcp Per (Inactive)  Location: Home  Delivery: TeleHealth     Virtual Encounter - Pain Management PROVIDER NOTE: Information contained herein reflects review and annotations entered in association with encounter. Interpretation of such information and data should be left to medically-trained personnel. Information provided to patient can be located elsewhere in the medical record under "Patient Instructions". Document created using STT-dictation technology, any transcriptional errors that may result from process are unintentional.    Contact & Pharmacy Preferred: [phone] Home: [phone] (home) Mobile: [phone] (mobile) E-mail: No e-mail address on record  [place], [place] - [place] [address] [place] [address] Phone: [phone] Fax: [phone]   Pre-screening  Cindy Rose offered "in-person" vs "virtual" encounter. She indicated preferring virtual for this encounter.   Reason COVID-19*  Social distancing based on CDC and AMA recommendations.   I contacted Cindy Rose on 07/21/2020 via video conference.      I clearly identified myself as Gillis Santa, MD. I verified that I was speaking with the correct person using two identifiers (Name: Cindy Rose, and date of birth: Cindy Rose, Cindy Rose).  Consent I sought verbal advanced consent from Cindy Rose for virtual visit interactions. I informed Cindy Rose of possible security and privacy concerns, risks, and limitations associated with providing "not-in-person" medical evaluation and management services. I also informed Cindy Rose of the availability of "in-person" appointments. Finally, I  informed her that there would be a charge for the virtual visit and that she could be  personally, fully or partially, financially responsible for it. Cindy Rose expressed understanding and agreed to proceed.   Historic Elements   Cindy Rose is a 75 y.o. year old, female patient evaluated today after our last contact on 06/09/2020. Cindy Rose  has a past medical history of Allergy, Anxiety, Arthritis, Cancer ([place]) (09/2017), Hyperlipidemia, and Hypertension. She also  has a past surgical history that includes Joint replacement; Abdominal hysterectomy; Wrist surgery; and Hernia repair (2021). Cindy Rose has a current medication list which includes the following prescription(s): baclofen, calcium-magnesium-zinc, gabapentin, hydrochlorothiazide, [START ON 08/01/2020] hydrocodone-acetaminophen, [START ON 08/31/2020] hydrocodone-acetaminophen, losartan, naproxen, omeprazole, pravastatin, and valsartan-hydrochlorothiazide. She  reports that she quit smoking about 13 years ago. Her smoking use included cigarettes. She has never used smokeless tobacco. She reports that she does not drink alcohol and does not use drugs. Cindy Rose is allergic to other, tape, zoster vaccine live, and seasonal ic [cholestatin].   HPI  Today, she is being contacted for medication management.   No change in medical history since last visit.  Patient's pain is at baseline.  Patient continues multimodal pain regimen as prescribed.  States that it provides pain relief and improvement in functional status.  Pharmacotherapy Assessment  Analgesic: Hydrocodone 7.5 mg 3 times daily PRN, quantity 90/month, MME equals 22.5   Monitoring: [place] PMP: PDMP reviewed during this encounter.       Pharmacotherapy: No side-effects or adverse reactions reported. Compliance: No problems identified. Effectiveness: Clinically acceptable. Plan: Refer to "POC".  UDS:  Summary  Date Value Ref Range Status  10/16/2019 Note  Corrected     Comment:    ==================================================================== [employer] Select 13 (MW) ==================================================================== Test  Result       Flag       Units  Drug Present and Declared for Prescription Verification   Hydrocodone                    833          EXPECTED   ng/mg creat   Norhydrocodone                 1241         EXPECTED   ng/mg creat    Sources of hydrocodone include scheduled prescription medications.    Norhydrocodone is an expected metabolite of hydrocodone.  Drug Present not Declared for Prescription Verification   Oxazepam                       50           UNEXPECTED ng/mg creat    Oxazepam may be administered as a scheduled prescription medication;    it is also an expected metabolite of other benzodiazepine drugs,    including diazepam, chlordiazepoxide, prazepam, clorazepate,    halazepam, and temazepam.  ==================================================================== Test                      Result    Flag   Units      Ref Range   Creatinine              46               mg/dL      >=20 ==================================================================== Declared Medications:  The flagging and interpretation on this report are based on the  following declared medications.  Unexpected results may arise from  inaccuracies in the declared medications.   **Note: The testing scope of this panel includes these medications:   Hydrocodone   **Note: The testing scope of this panel does not include the  following reported medications:   Acetaminophen  Baclofen  Calcium  Gabapentin  Hydrochlorothiazide  Losartan (Cozaar)  Magnesium  Naproxen  Omeprazole  Pravastatin  Valsartan  Zinc ==================================================================== For clinical consultation, please call [phone]. ====================================================================     Assessment  The primary encounter diagnosis was Primary osteoarthritis of both knees. Diagnoses of Chronic pain syndrome, Lumbar spondylosis, Lumbar degenerative disc disease, Myofascial pain, and History of bilateral knee replacement were also pertinent to this visit.  Plan of Care   Cindy Rose has a current medication list which includes the following long-term medication(s): gabapentin, hydrochlorothiazide, losartan, omeprazole, pravastatin, and valsartan-hydrochlorothiazide.  Pharmacotherapy (Medications Ordered): Meds ordered this encounter  Medications  . HYDROcodone-acetaminophen (NORCO) 7.5-325 MG tablet    Sig: Take 1 tablet by mouth every 8 (eight) hours as needed for severe pain. Must last 30 days.    Dispense:  90 tablet    Refill:  0    [place] STOP ACT - Not applicable. Fill one day early if pharmacy is closed on scheduled refill date.  Marland Kitchen HYDROcodone-acetaminophen (NORCO) 7.5-325 MG tablet    Sig: Take 1 tablet by mouth every 8 (eight) hours as needed for severe pain. Must last 30 days.    Dispense:  90 tablet    Refill:  0    [place] STOP ACT - Not applicable. Fill one day early if pharmacy is closed on scheduled refill date.  . gabapentin (NEURONTIN) 300 MG capsule    Sig: Take 1 capsule (300 mg total) by mouth 3 (three)  times daily.    Dispense:  90 capsule    Refill:  5   Follow-up plan:   Return in about 9 weeks (around 09/22/2020) for Medication Management, in person.   Recent Visits Date Type Provider Dept  Rose/26/22 Telemedicine Gillis Santa, MD [facility]  Showing recent visits within past 90 days and meeting all other requirements Today's Visits Date Type Provider Dept  07/21/20 Telemedicine Gillis Santa, MD [facility]  Showing today's visits and meeting all other requirements Future Appointments No visits were found meeting these  conditions. Showing future appointments within next 90 days and meeting all other requirements  I discussed the assessment and treatment plan with the patient. The patient was provided an opportunity to ask questions and all were answered. The patient agreed with the plan and demonstrated an understanding of the instructions.  Patient advised to call back or seek an in-person evaluation if the symptoms or condition worsens.  Duration of encounter: 30 minutes.  Note by: Gillis Santa, MD Date: 07/21/2020; Time: 1:34 PM

## 2020-07-23 ENCOUNTER — Encounter: Payer: Medicare HMO | Admitting: Student in an Organized Health Care Education/Training Program

## 2020-09-01 ENCOUNTER — Telehealth: Payer: Self-pay | Admitting: Student in an Organized Health Care Education/Training Program

## 2020-09-01 NOTE — Telephone Encounter (Signed)
Information provided to pharmacist.

## 2020-09-01 NOTE — Telephone Encounter (Signed)
[facility] needs more info on diagnosis and treatment plan before they can fill medications due to new [employer]. Please call pharmacy

## 2020-09-09 ENCOUNTER — Encounter: Payer: Medicare HMO | Admitting: Student in an Organized Health Care Education/Training Program

## 2020-09-10 ENCOUNTER — Encounter: Payer: Medicare HMO | Admitting: Student in an Organized Health Care Education/Training Program

## 2020-09-21 ENCOUNTER — Encounter: Payer: Self-pay | Admitting: Student in an Organized Health Care Education/Training Program

## 2020-09-21 ENCOUNTER — Ambulatory Visit
Payer: Medicare HMO | Attending: Student in an Organized Health Care Education/Training Program | Admitting: Student in an Organized Health Care Education/Training Program

## 2020-09-21 ENCOUNTER — Other Ambulatory Visit: Payer: Self-pay

## 2020-09-21 VITALS — BP 129/78 | HR 62 | Temp 97.1°F | Resp 16 | Ht 64.0 in | Wt 220.0 lb

## 2020-09-21 DIAGNOSIS — M47816 Spondylosis without myelopathy or radiculopathy, lumbar region: Secondary | ICD-10-CM | POA: Diagnosis not present

## 2020-09-21 DIAGNOSIS — M5136 Other intervertebral disc degeneration, lumbar region: Secondary | ICD-10-CM | POA: Diagnosis not present

## 2020-09-21 DIAGNOSIS — M170 Bilateral primary osteoarthritis of knee: Secondary | ICD-10-CM | POA: Diagnosis not present

## 2020-09-21 DIAGNOSIS — M7918 Myalgia, other site: Secondary | ICD-10-CM | POA: Insufficient documentation

## 2020-09-21 DIAGNOSIS — G894 Chronic pain syndrome: Secondary | ICD-10-CM

## 2020-09-21 DIAGNOSIS — C182 Malignant neoplasm of ascending colon: Secondary | ICD-10-CM | POA: Diagnosis present

## 2020-09-21 MED ORDER — HYDROCODONE-ACETAMINOPHEN 7.5-325 MG PO TABS: 1.0000 | ORAL_TABLET | Freq: Three times a day (TID) | ORAL | 0 refills | Status: DC | PRN

## 2020-09-21 MED ORDER — HYDROCODONE-ACETAMINOPHEN 7.5-325 MG PO TABS: 1.0000 | ORAL_TABLET | Freq: Three times a day (TID) | ORAL | 0 refills | Status: AC | PRN

## 2020-09-21 NOTE — Progress Notes (Signed)
Nursing Pain Medication Assessment:  Safety precautions to be maintained throughout the outpatient stay will include: orient to surroundings, keep bed in low position, maintain call bell within reach at all times, provide assistance with transfer out of bed and ambulation.  Medication Inspection Compliance: Pill count conducted under aseptic conditions, in front of the patient. Neither the pills nor the bottle was removed from the patient's sight at any time. Once count was completed pills were immediately returned to the patient in their original bottle.  Medication: Hydrocodone/APAP Pill/Patch Count: 34 of 90 pills remain Pill/Patch Appearance: Markings consistent with prescribed medication Bottle Appearance: Standard pharmacy container. Clearly labeled. Filled Date: 05 / 17 / 2022 Last Medication intake:  Today

## 2020-09-21 NOTE — Progress Notes (Signed)
PROVIDER NOTE: Information contained herein reflects review and annotations entered in association with encounter. Interpretation of such information and data should be left to medically-trained personnel. Information provided to patient can be located elsewhere in the medical record under "Patient Instructions". Document created using STT-dictation technology, any transcriptional errors that may result from process are unintentional.    Patient: Cindy Rose  Service Category: E/M  Provider: Gillis Santa, MD  DOB: 03/12/1946  DOS: 09/21/2020  Specialty: Interventional Pain Management  MRN: [id number]  Setting: Ambulatory outpatient  PCP: Patient, No Pcp Per (Inactive)  Type: Established Patient    Referring Provider: No ref. provider found  Location: Office  Delivery: Face-to-face     HPI  Cindy Rose, a 75 y.o. year old female, is here today because of her Chronic pain syndrome [G89.4]. Cindy Rose's primary complain today is Joint Pain (Arthritis ), Knee Pain (Bilateral s/p joint replacement ), Shoulder Pain (Right ), and Foot Pain (Bilateral ) Last encounter: My last encounter with her was on 09/01/2020. Pertinent problems: Cindy Rose has Primary osteoarthritis of both knees; Chronic pain syndrome; History of bilateral knee replacement; Anxiety state; Myofascial pain; Lumbar spondylosis; Bilateral hearing loss; GERD (gastroesophageal reflux disease); Spondylosis of lumbar region without myelopathy or radiculopathy; and Lumbar degenerative disc disease on their pertinent problem list. Pain Assessment: Severity of Chronic pain is reported as a 7 /10. Location: Knee Left,Right/shoulder pain down the right arm. knee pain radiates down the shins. Onset: More than a month ago. Quality: Pressure,Discomfort,Constant,Sharp. Timing: Constant. Modifying factor(s): pain medication, baclofen. tylenol, voltaren gel.. Vitals:  height is 5' 4"  (1.626 m) and weight is 220 lb (99.8 kg). Her temporal  temperature is 97.1 F (36.2 C) (abnormal). Her blood pressure is 129/78 and her pulse is 62. Her respiration is 16 and oxygen saturation is 98%.   Reason for encounter: medication management.    No change in medical history since last visit.  Patient's pain is at baseline.  Patient continues multimodal pain regimen as prescribed.  States that it provides pain relief and improvement in functional status. Patient did get hearing aids, has had them for in since March.  Pharmacotherapy Assessment   Analgesic: Hydrocodone 7.5 mg 3 times daily PRN, quantity 90/month, MME equals 22.5   Monitoring: [place] PMP: PDMP not reviewed this encounter.       Pharmacotherapy: No side-effects or adverse reactions reported. Compliance: No problems identified. Effectiveness: Clinically acceptable.  Janett Billow, RN  09/21/2020  1:54 PM  Sign when Signing Visit Nursing Pain Medication Assessment:  Safety precautions to be maintained throughout the outpatient stay will include: orient to surroundings, keep bed in low position, maintain call bell within reach at all times, provide assistance with transfer out of bed and ambulation.  Medication Inspection Compliance: Pill count conducted under aseptic conditions, in front of the patient. Neither the pills nor the bottle was removed from the patient's sight at any time. Once count was completed pills were immediately returned to the patient in their original bottle.  Medication: Hydrocodone/APAP Pill/Patch Count: 34 of 90 pills remain Pill/Patch Appearance: Markings consistent with prescribed medication Bottle Appearance: Standard pharmacy container. Clearly labeled. Filled Date: 05 / 17 / 2022 Last Medication intake:  Today    UDS:  Summary  Date Value Ref Range Status  10/16/2019 Note  Corrected    Comment:    ==================================================================== [employer] Select 13  (MW) ==================================================================== Test  Result       Flag       Units  Drug Present and Declared for Prescription Verification   Hydrocodone                    833          EXPECTED   ng/mg creat   Norhydrocodone                 1241         EXPECTED   ng/mg creat    Sources of hydrocodone include scheduled prescription medications.    Norhydrocodone is an expected metabolite of hydrocodone.  Drug Present not Declared for Prescription Verification   Oxazepam                       50           UNEXPECTED ng/mg creat    Oxazepam may be administered as a scheduled prescription medication;    it is also an expected metabolite of other benzodiazepine drugs,    including diazepam, chlordiazepoxide, prazepam, clorazepate,    halazepam, and temazepam.  ==================================================================== Test                      Result    Flag   Units      Ref Range   Creatinine              46               mg/dL      >=20 ==================================================================== Declared Medications:  The flagging and interpretation on this report are based on the  following declared medications.  Unexpected results may arise from  inaccuracies in the declared medications.   **Note: The testing scope of this panel includes these medications:   Hydrocodone   **Note: The testing scope of this panel does not include the  following reported medications:   Acetaminophen  Baclofen  Calcium  Gabapentin  Hydrochlorothiazide  Losartan (Cozaar)  Magnesium  Naproxen  Omeprazole  Pravastatin  Valsartan  Zinc ==================================================================== For clinical consultation, please call [phone]. ====================================================================      ROS  Constitutional: Denies any fever or chills Gastrointestinal: No reported hemesis,  hematochezia, vomiting, or acute GI distress Musculoskeletal: Denies any acute onset joint swelling, redness, loss of ROM, or weakness Neurological: No reported episodes of acute onset apraxia, aphasia, dysarthria, agnosia, amnesia, paralysis, loss of coordination, or loss of consciousness  Medication Review  Calcium-Magnesium-Zinc, HYDROcodone-acetaminophen, baclofen, gabapentin, hydrochlorothiazide, losartan, naproxen, omeprazole, pravastatin, and valsartan-hydrochlorothiazide  History Review  Allergy: Cindy Rose is allergic to other, tape, zoster vaccine live, and seasonal ic [cholestatin]. Drug: Cindy Rose  reports no history of drug use. Alcohol:  reports no history of alcohol use. Tobacco:  reports that she quit smoking about 13 years ago. Her smoking use included cigarettes. She has never used smokeless tobacco. Social: Cindy Rose  reports that she quit smoking about 13 years ago. Her smoking use included cigarettes. She has never used smokeless tobacco. She reports that she does not drink alcohol and does not use drugs. Medical:  has a past medical history of Allergy, Anxiety, Arthritis, Cancer ([place]) (09/2017), Hyperlipidemia, and Hypertension. Surgical: Cindy Rose  has a past surgical history that includes Joint replacement; Abdominal hysterectomy; Wrist surgery; and Hernia repair (2021). Family: family history includes Cancer in her father; Heart disease in her mother.  Laboratory Chemistry Profile   Renal  No results found for: BUN, CREATININE, LABCREA, BCR, GFR, GFRAA, GFRNONAA, LABVMA, EPIRU, EPINEPH24HUR, NOREPRU, NOREPI24HUR, DOPARU, [id number]   Hepatic No results found for: AST, ALT, ALBUMIN, ALKPHOS, HCVAB, AMYLASE, LIPASE, AMMONIA   Electrolytes No results found for: NA, K, CL, CALCIUM, MG, PHOS   Bone No results found for: VD25OH, [id number], [id number], [id number], 25OHVITD1, 25OHVITD2, 25OHVITD3, TESTOFREE, TESTOSTERONE   Inflammation (CRP: Acute Phase)  (ESR: Chronic Phase) No results found for: CRP, ESRSEDRATE, LATICACIDVEN     Note: Above Lab results reviewed.  Physical Exam  General appearance: Well nourished, well developed, and well hydrated. In no apparent acute distress Mental status: Alert, oriented x 3 (person, place, & time)       Respiratory: No evidence of acute respiratory distress Eyes: PERLA Vitals: BP 129/78 (BP Location: Left Arm, Patient Position: Sitting, Cuff Size: Large)   Pulse 62   Temp (!) 97.1 F (36.2 C) (Temporal)   Resp 16   Ht 5' 4"  (1.626 m)   Wt 220 lb (99.8 kg)   SpO2 98%   BMI 37.76 kg/m  BMI: Estimated body mass index is 37.76 kg/m as calculated from the following:   Height as of this encounter: 5' 4"  (1.626 m).   Weight as of this encounter: 220 lb (99.8 kg). Ideal: Ideal body weight: 54.7 kg (120 lb 9.5 oz) Adjusted ideal body weight: 72.7 kg (160 lb 5.7 oz)   Lumbar Spine Area Exam  Skin & Axial Inspection:No masses, redness, or swelling Alignment:Symmetrical Functional VVZ:SMOL restricted ROM Stability:No instability detected Muscle Tone/Strength:Functionally intact. No obvious neuro-muscular anomalies detected. Sensory (Neurological):Musculoskeletal pain pattern Palpation:Complains of area being tender to palpation  Gait & Posture Assessment  Ambulation:Unassisted Gait:Relatively normal for age and body habitus Posture:WNL Lower Extremity Exam    Side:Right lower extremity  Side:Left lower extremity  Stability:No instability observed  Stability:No instability observed  Skin & Extremity Inspection:Skin color, temperature, and hair growth are WNL. No peripheral edema or cyanosis. No masses, redness, swelling, asymmetry, or associated skin lesions. No contractures.  Skin & Extremity Inspection:Skin color, temperature, and hair growth are WNL. No peripheral edema or cyanosis. No masses, redness, swelling, asymmetry, or associated skin  lesions. No contractures.  Functional MBE:MLJQ restricted ROMfor knee joint   Functional GBE:EFEO restricted ROMfor knee joint   Muscle Tone/Strength:Functionally intact. No obvious neuro-muscular anomalies detected.  Muscle Tone/Strength:Functionally intact. No obvious neuro-muscular anomalies detected.  Sensory (Neurological):Arthropathic arthralgia  Sensory (Neurological):Arthropathic arthralgia  DTR: Patellar:deferred today Achilles:deferred today Plantar:deferred today  DTR: Patellar:deferred today Achilles:deferred today Plantar:deferred today  Palpation:No palpable anomalies  Palpation:No palpable anomalies    Assessment   Status Diagnosis  Controlled Controlled Controlled 1. Chronic pain syndrome   2. Primary osteoarthritis of both knees   3. Lumbar spondylosis   4. Lumbar degenerative disc disease   5. Myofascial pain   6. Malignant neoplasm of ascending colon [facility])      Plan of Care   Cindy Rose has a current medication list which includes the following long-term medication(s): hydrochlorothiazide, losartan, omeprazole, pravastatin, valsartan-hydrochlorothiazide, and gabapentin.  Pharmacotherapy (Medications Ordered): Meds ordered this encounter  Medications  . HYDROcodone-acetaminophen (NORCO) 7.5-325 MG tablet    Sig: Take 1 tablet by mouth every 8 (eight) hours as needed for severe pain. Must last 30 days.    Dispense:  90 tablet    Refill:  0    [place] STOP ACT - Not applicable. Fill one day early if pharmacy is closed on scheduled refill date.  Marland Kitchen HYDROcodone-acetaminophen (NORCO) 7.5-325  MG tablet    Sig: Take 1 tablet by mouth every 8 (eight) hours as needed for severe pain. Must last 30 days.    Dispense:  90 tablet    Refill:  0    [place] STOP ACT - Not applicable. Fill one day early if pharmacy is closed on scheduled refill date.  Marland Kitchen HYDROcodone-acetaminophen (NORCO) 7.5-325 MG tablet    Sig: Take 1  tablet by mouth every 8 (eight) hours as needed for severe pain. Must last 30 days.    Dispense:  90 tablet    Refill:  0    [place] STOP ACT - Not applicable. Fill one day early if pharmacy is closed on scheduled refill date.   Orders:  Orders Placed This Encounter  Procedures  . [employer] Select 13 (MW), Urine    Volume: 30 ml(s). Minimum 3 ml of urine is needed. Document temperature of fresh sample. Indications: Long term (current) use of opiate analgesic [phone])    Order Specific Question:   Release to patient    Answer:   Immediate   Follow-up plan:   Return in about 3 months (around 12/24/2020) for Medication Management, in person.   Recent Visits Date Type Provider Dept  07/21/20 Telemedicine Gillis Santa, MD [facility]  Showing recent visits within past 90 days and meeting all other requirements Today's Visits Date Type Provider Dept  09/21/20 Office Visit Gillis Santa, MD [facility]  Showing today's visits and meeting all other requirements Future Appointments No visits were found meeting these conditions. Showing future appointments within next 90 days and meeting all other requirements  I discussed the assessment and treatment plan with the patient. The patient was provided an opportunity to ask questions and all were answered. The patient agreed with the plan and demonstrated an understanding of the instructions.  Patient advised to call back or seek an in-person evaluation if the symptoms or condition worsens.  Duration of encounter: 30 minutes.  Note by: Gillis Santa, MD Date: 09/21/2020; Time: 2:07 PM

## 2020-09-26 LAB — TOXASSURE SELECT 13 (MW), URINE

## 2020-10-14 ENCOUNTER — Encounter: Payer: Medicare HMO | Admitting: Student in an Organized Health Care Education/Training Program

## 2020-12-24 ENCOUNTER — Encounter: Payer: Medicare HMO | Admitting: Student in an Organized Health Care Education/Training Program

## 2020-12-28 ENCOUNTER — Encounter: Payer: Self-pay | Admitting: Student in an Organized Health Care Education/Training Program

## 2020-12-28 ENCOUNTER — Ambulatory Visit
Payer: Medicare HMO | Attending: Student in an Organized Health Care Education/Training Program | Admitting: Student in an Organized Health Care Education/Training Program

## 2020-12-28 ENCOUNTER — Other Ambulatory Visit: Payer: Self-pay

## 2020-12-28 VITALS — BP 141/66 | HR 60 | Temp 97.0°F | Resp 18 | Ht 64.0 in | Wt 220.0 lb

## 2020-12-28 DIAGNOSIS — G894 Chronic pain syndrome: Secondary | ICD-10-CM | POA: Insufficient documentation

## 2020-12-28 DIAGNOSIS — M170 Bilateral primary osteoarthritis of knee: Secondary | ICD-10-CM | POA: Insufficient documentation

## 2020-12-28 DIAGNOSIS — M47816 Spondylosis without myelopathy or radiculopathy, lumbar region: Secondary | ICD-10-CM | POA: Diagnosis not present

## 2020-12-28 DIAGNOSIS — M7918 Myalgia, other site: Secondary | ICD-10-CM | POA: Diagnosis present

## 2020-12-28 DIAGNOSIS — M5136 Other intervertebral disc degeneration, lumbar region: Secondary | ICD-10-CM | POA: Insufficient documentation

## 2020-12-28 MED ORDER — GABAPENTIN 300 MG PO CAPS: 300.0000 mg | ORAL_CAPSULE | Freq: Three times a day (TID) | ORAL | 5 refills | Status: DC

## 2020-12-28 MED ORDER — HYDROCODONE-ACETAMINOPHEN 7.5-325 MG PO TABS: 1.0000 | ORAL_TABLET | Freq: Three times a day (TID) | ORAL | 0 refills | Status: AC | PRN

## 2020-12-28 MED ORDER — HYDROCODONE-ACETAMINOPHEN 7.5-325 MG PO TABS: 1.0000 | ORAL_TABLET | Freq: Three times a day (TID) | ORAL | 0 refills | Status: DC | PRN

## 2020-12-28 NOTE — Progress Notes (Signed)
PROVIDER NOTE: Information contained herein reflects review and annotations entered in association with encounter. Interpretation of such information and data should be left to medically-trained personnel. Information provided to patient can be located elsewhere in the medical record under "Patient Instructions". Document created using STT-dictation technology, any transcriptional errors that may result from process are unintentional.    Patient: Cindy Rose  Service Category: E/M  Provider: Gillis Santa, MD  DOB: February 02, 1946  DOS: 12/28/2020  Specialty: Interventional Pain Management  MRN: [id number]  Setting: Ambulatory outpatient  PCP: Kathryne Sharper, MD  Type: Established Patient    Referring Provider: No ref. provider found  Location: Office  Delivery: Face-to-face     HPI  Cindy Rose, a 75 y.o. year old female, is here today because of her Chronic pain syndrome [G89.4]. Ms. Daman's primary complain today is 75 Shoulder Pain (right) and Foot Pain (left) Last encounter: My last encounter with her was on 09/21/2020. Pertinent problems: Cindy Rose has Primary osteoarthritis of both knees; Chronic pain syndrome; History of bilateral knee replacement; Anxiety state; Myofascial pain; Lumbar spondylosis; Bilateral hearing loss; GERD (gastroesophageal reflux disease); Spondylosis of lumbar region without myelopathy or radiculopathy; and Lumbar degenerative disc disease on their pertinent problem list. Pain Assessment: Severity of Chronic pain is reported as a 7 /10. Location: Shoulder Right/radiates from shoulder to elbow. Onset: More than a month ago. Quality: Stabbing, Aching, Throbbing. Timing: Constant. Modifying factor(s): naproxen. Vitals:  height is _0  (1.626 m) and weight is 220 lb (99.8 kg). Her temperature is 97 F (36.1 C) (abnormal). Her blood pressure is 141/66 (abnormal) and her pulse is 60. Her respiration is 18 and oxygen saturation is 97%.   Reason for encounter:  medication management.    Hurt left foot a couple of days ago, has been utilizing sole support  I offered her an xray of her left foot but she declined Is having increased pain in her right shoulder, I did offer a right glenohumeral join injection but she is going to ask her PCP if there is someone closer Otherwise, refill Hydrocodone as below.  Pharmacotherapy Assessment   Analgesic: Hydrocodone 7.5 mg 3 times daily PRN, quantity 90/month, MME equals 22.5   Monitoring: [place] PMP: PDMP reviewed during this encounter.       Pharmacotherapy: No side-effects or adverse reactions reported. Compliance: No problems identified. Effectiveness: Clinically acceptable.  Dewayne Shorter, RN  12/28/2020  1:41 PM  Sign when Signing Visit Nursing Pain Medication Assessment:  Safety precautions to be maintained throughout the outpatient stay will include: orient to surroundings, keep bed in low position, maintain call bell within reach at all times, provide assistance with transfer out of bed and ambulation.  Medication Inspection Compliance: Pill count conducted under aseptic conditions, in front of the patient. Neither the pills nor the bottle was removed from the patient's sight at any time. Once count was completed pills were immediately returned to the patient in their original bottle.  Medication: Hydrocodone/APAP Pill/Patch Count:  60 of 90 pills remain Pill/Patch Appearance: Markings consistent with prescribed medication Bottle Appearance: Standard pharmacy container. Clearly labeled. Filled Date: 08 / 22 / 2022 Last Medication intake:  Today      UDS:  Summary  Date Value Ref Range Status  09/21/2020 Note  Final    Comment:    ==================================================================== [facility] (MW) ==================================================================== Test  Result       Flag       Units  Drug Present and Declared for  Prescription Verification   Hydrocodone                    611          EXPECTED   ng/mg creat   Norhydrocodone                 1567         EXPECTED   ng/mg creat    Sources of hydrocodone include scheduled prescription medications.    Norhydrocodone is an expected metabolite of hydrocodone.  ==================================================================== Test                      Result    Flag   Units      Ref Range   Creatinine              27               mg/dL      >=20 ==================================================================== Declared Medications:  The flagging and interpretation on this report are based on the  following declared medications.  Unexpected results may arise from  inaccuracies in the declared medications.   **Note: The testing scope of this panel includes these medications:   Hydrocodone (Norco)   **Note: The testing scope of this panel does not include the  following reported medications:   Acetaminophen (Norco)  Baclofen (Lioresal)  Gabapentin (Neurontin)  Hydrochlorothiazide (Hydrodiuril)  Losartan (Cozaar)  Naproxen (Naprosyn)  Omeprazole (Prilosec)  Pravastatin (Pravachol)  Valsartan (Diovan) ==================================================================== For clinical consultation, please call [phone]. ====================================================================      ROS  Constitutional: Denies any fever or chills Gastrointestinal: No reported hemesis, hematochezia, vomiting, or acute GI distress Musculoskeletal: Denies any acute onset joint swelling, redness, loss of ROM, or weakness Neurological: No reported episodes of acute onset apraxia, aphasia, dysarthria, agnosia, amnesia, paralysis, loss of coordination, or loss of consciousness  Medication Review  Calcium-Magnesium-Zinc, HYDROcodone-acetaminophen, baclofen, gabapentin, hydrochlorothiazide, losartan, naproxen, omeprazole, pravastatin, and  valsartan-hydrochlorothiazide  History Review  Allergy: Cindy Rose is allergic to other, tape, zoster vaccine live, and seasonal ic [cholestatin]. Drug: Cindy Rose  reports no history of drug use. Alcohol:  reports no history of alcohol use. Tobacco:  reports that she quit smoking about 14 years ago. Her smoking use included cigarettes. She has never used smokeless tobacco. Social: Ms. Penton  reports that she quit smoking about 14 years ago. Her smoking use included cigarettes. She has never used smokeless tobacco. She reports that she does not drink alcohol and does not use drugs. Medical:  has a past medical history of Allergy, Anxiety, Arthritis, Cancer ([place]) (09/2017), Hyperlipidemia, and Hypertension. Surgical: Ms. Marshburn  has a past surgical history that includes Joint replacement; Abdominal hysterectomy; Wrist surgery; and Hernia repair (2021). Family: family history includes Cancer in her father; Heart disease in her mother.  Laboratory Chemistry Profile   Renal No results found for: BUN, CREATININE, LABCREA, BCR, GFR, GFRAA, GFRNONAA, LABVMA, EPIRU, EPINEPH24HUR, NOREPRU, NOREPI24HUR, DOPARU, [id number]   Hepatic No results found for: AST, ALT, ALBUMIN, ALKPHOS, HCVAB, AMYLASE, LIPASE, AMMONIA   Electrolytes No results found for: NA, K, CL, CALCIUM, MG, PHOS   Bone No results found for: VD25OH, [id number], [id number], HY0737TG6, 25OHVITD1, 25OHVITD2, 25OHVITD3, TESTOFREE, TESTOSTERONE   Inflammation (CRP: Acute Phase) (ESR: Chronic Phase) No results found for: CRP, ESRSEDRATE, LATICACIDVEN  Note: Above Lab results reviewed.  Physical Exam  General appearance: Well nourished, well developed, and well hydrated. In no apparent acute distress Mental status: Alert, oriented x 3 (person, place, & time)       Respiratory: No evidence of acute respiratory distress Eyes: PERLA Vitals: BP (!) 141/66 (BP Location: Right Arm, Patient Position: Sitting, Cuff Size:  Normal)   Pulse 60   Temp (!) 97 F (36.1 C)   Resp 18   Ht _0  (1.626 m)   Wt 220 lb (99.8 kg)   SpO2 97%   BMI 37.76 kg/m  BMI: Estimated body mass index is 37.76 kg/m as calculated from the following:   Height as of this encounter: _1  (1.626 m).   Weight as of this encounter: 220 lb (99.8 kg). Ideal: Ideal body weight: 54.7 kg (120 lb 9.5 oz) Adjusted ideal body weight: 72.7 kg (160 lb 5.7 oz)   Lumbar Spine Area Exam  Skin & Axial Inspection: No masses, redness, or swelling Alignment: Symmetrical Functional ROM: Pain restricted ROM       Stability: No instability detected Muscle Tone/Strength: Functionally intact. No obvious neuro-muscular anomalies detected. Sensory (Neurological): Musculoskeletal pain pattern Palpation: Complains of area being tender to palpation         Gait & Posture Assessment  Ambulation: Unassisted Gait: Relatively normal for age and body habitus Posture: WNL  Lower Extremity Exam      Side: Right lower extremity   Side: Left lower extremity  Stability: No instability observed           Stability: No instability observed          Skin & Extremity Inspection: Skin color, temperature, and hair growth are WNL. No peripheral edema or cyanosis. No masses, redness, swelling, asymmetry, or associated skin lesions. No contractures.   Skin & Extremity Inspection: Skin color, temperature, and hair growth are WNL. No peripheral edema or cyanosis. No masses, redness, swelling, asymmetry, or associated skin lesions. No contractures.  Functional ROM: Pain restricted ROM for knee joint           Functional ROM: Pain restricted ROM for knee joint          Muscle Tone/Strength: Functionally intact. No obvious neuro-muscular anomalies detected.   Muscle Tone/Strength: Functionally intact. No obvious neuro-muscular anomalies detected.  Sensory (Neurological): Arthropathic arthralgia         Sensory (Neurological): Arthropathic arthralgia        DTR: Patellar:  deferred today Achilles: deferred today Plantar: deferred today   DTR: Patellar: deferred today Achilles: deferred today Plantar: deferred today  Palpation: No palpable anomalies   Palpation: No palpable anomalies     Assessment   Status Diagnosis  Controlled Controlled Controlled 1. Chronic pain syndrome   2. Primary osteoarthritis of both knees   3. Lumbar degenerative disc disease   4. Lumbar spondylosis   5. Myofascial pain       Plan of Care   Ms. Addelyn Alleman has a current medication list which includes the following long-term medication(s): hydrochlorothiazide, losartan, omeprazole, pravastatin, valsartan-hydrochlorothiazide, and gabapentin.  Pharmacotherapy (Medications Ordered): Meds ordered this encounter  Medications   HYDROcodone-acetaminophen (NORCO) 7.5-325 MG tablet    Sig: Take 1 tablet by mouth every 8 (eight) hours as needed for severe pain. Must last 30 days.    Dispense:  90 tablet    Refill:  0    [facility] STOP ACT - Not applicable. Fill one day early if pharmacy is closed  on scheduled refill date.   HYDROcodone-acetaminophen (NORCO) 7.5-325 MG tablet    Sig: Take 1 tablet by mouth every 8 (eight) hours as needed for severe pain. Must last 30 days.    Dispense:  90 tablet    Refill:  0    [place] STOP ACT - Not applicable. Fill one day early if pharmacy is closed on scheduled refill date.   HYDROcodone-acetaminophen (NORCO) 7.5-325 MG tablet    Sig: Take 1 tablet by mouth every 8 (eight) hours as needed for severe pain. Must last 30 days.    Dispense:  90 tablet    Refill:  0    [place] STOP ACT - Not applicable. Fill one day early if pharmacy is closed on scheduled refill date.   gabapentin (NEURONTIN) 300 MG capsule    Sig: Take 1 capsule (300 mg total) by mouth 3 (three) times daily.    Dispense:  90 capsule    Refill:  5    Orders:  No orders of the defined types were placed in this encounter.  Follow-up plan:   Return in about 3 months (around  04/01/2021) for Medication Management, in person.   Recent Visits No visits were found meeting these conditions. Showing recent visits within past 90 days and meeting all other requirements Today's Visits Date Type Provider Dept  12/28/20 Office Visit Gillis Santa, MD [facility]  Showing today's visits and meeting all other requirements Future Appointments No visits were found meeting these conditions. Showing future appointments within next 90 days and meeting all other requirements I discussed the assessment and treatment plan with the patient. The patient was provided an opportunity to ask questions and all were answered. The patient agreed with the plan and demonstrated an understanding of the instructions.  Patient advised to call back or seek an in-person evaluation if the symptoms or condition worsens.  Duration of encounter: 30 minutes.  Note by: Gillis Santa, MD Date: 12/28/2020; Time: 2:13 PM

## 2020-12-28 NOTE — Progress Notes (Signed)
Nursing Pain Medication Assessment:  Safety precautions to be maintained throughout the outpatient stay will include: orient to surroundings, keep bed in low position, maintain call bell within reach at all times, provide assistance with transfer out of bed and ambulation.  Medication Inspection Compliance: Pill count conducted under aseptic conditions, in front of the patient. Neither the pills nor the bottle was removed from the patient's sight at any time. Once count was completed pills were immediately returned to the patient in their original bottle.  Medication: Hydrocodone/APAP Pill/Patch Count:  60 of 90 pills remain Pill/Patch Appearance: Markings consistent with prescribed medication Bottle Appearance: Standard pharmacy container. Clearly labeled. Filled Date: 08 / 22 / 2022 Last Medication intake:  Today

## 2021-01-31 ENCOUNTER — Other Ambulatory Visit: Payer: Self-pay | Admitting: Student in an Organized Health Care Education/Training Program

## 2021-01-31 DIAGNOSIS — G894 Chronic pain syndrome: Secondary | ICD-10-CM

## 2021-02-12 ENCOUNTER — Telehealth: Payer: Self-pay

## 2021-02-12 NOTE — Telephone Encounter (Signed)
Patient would like a refill on baclofen

## 2021-02-15 MED ORDER — BACLOFEN 10 MG PO TABS: 10.0000 mg | ORAL_TABLET | Freq: Two times a day (BID) | ORAL | 5 refills | Status: DC | PRN

## 2021-03-24 ENCOUNTER — Telehealth: Payer: Self-pay | Admitting: *Deleted

## 2021-03-24 ENCOUNTER — Other Ambulatory Visit: Payer: Self-pay | Admitting: Student in an Organized Health Care Education/Training Program

## 2021-03-24 DIAGNOSIS — G894 Chronic pain syndrome: Secondary | ICD-10-CM

## 2021-03-24 MED ORDER — HYDROCODONE-ACETAMINOPHEN 7.5-325 MG PO TABS: 1.0000 | ORAL_TABLET | Freq: Three times a day (TID) | ORAL | 0 refills | Status: DC | PRN

## 2021-03-24 NOTE — Telephone Encounter (Signed)
Prescription for Hydrocodone cancelled and sent to [facility] per patient request.

## 2021-03-24 NOTE — Progress Notes (Signed)
Requested Prescriptions   Signed Prescriptions Disp Refills   HYDROcodone-acetaminophen (NORCO) 7.5-325 MG tablet 90 tablet 0    Sig: Take 1 tablet by mouth every 8 (eight) hours as needed for severe pain. Must last 30 days.   Patient's original pharmacy does not have stock of hydrocodone.  New prescription sent to [facility].  Previous prescription canceled.

## 2021-03-30 ENCOUNTER — Other Ambulatory Visit: Payer: Self-pay

## 2021-03-30 ENCOUNTER — Ambulatory Visit
Payer: Medicare HMO | Attending: Student in an Organized Health Care Education/Training Program | Admitting: Student in an Organized Health Care Education/Training Program

## 2021-03-30 ENCOUNTER — Encounter: Payer: Self-pay | Admitting: Student in an Organized Health Care Education/Training Program

## 2021-03-30 VITALS — BP 124/81 | HR 71 | Temp 97.0°F | Resp 15 | Ht 65.0 in | Wt 220.0 lb

## 2021-03-30 DIAGNOSIS — G894 Chronic pain syndrome: Secondary | ICD-10-CM

## 2021-03-30 DIAGNOSIS — Z96653 Presence of artificial knee joint, bilateral: Secondary | ICD-10-CM | POA: Diagnosis present

## 2021-03-30 DIAGNOSIS — M7918 Myalgia, other site: Secondary | ICD-10-CM

## 2021-03-30 DIAGNOSIS — M170 Bilateral primary osteoarthritis of knee: Secondary | ICD-10-CM

## 2021-03-30 DIAGNOSIS — M51369 Other intervertebral disc degeneration, lumbar region without mention of lumbar back pain or lower extremity pain: Secondary | ICD-10-CM

## 2021-03-30 DIAGNOSIS — M5136 Other intervertebral disc degeneration, lumbar region: Secondary | ICD-10-CM | POA: Diagnosis not present

## 2021-03-30 DIAGNOSIS — M47816 Spondylosis without myelopathy or radiculopathy, lumbar region: Secondary | ICD-10-CM | POA: Diagnosis not present

## 2021-03-30 DIAGNOSIS — C182 Malignant neoplasm of ascending colon: Secondary | ICD-10-CM

## 2021-03-30 MED ORDER — ORPHENADRINE CITRATE 30 MG/ML IJ SOLN
INTRAMUSCULAR | Status: AC
  Filled 2021-03-30: qty 2

## 2021-03-30 MED ORDER — GABAPENTIN 300 MG PO CAPS: 300.0000 mg | ORAL_CAPSULE | Freq: Three times a day (TID) | ORAL | 5 refills | Status: DC

## 2021-03-30 MED ORDER — HYDROCODONE-ACETAMINOPHEN 7.5-325 MG PO TABS: 1.0000 | ORAL_TABLET | Freq: Three times a day (TID) | ORAL | 0 refills | Status: DC | PRN

## 2021-03-30 MED ORDER — HYDROCODONE-ACETAMINOPHEN 7.5-325 MG PO TABS: 1.0000 | ORAL_TABLET | Freq: Three times a day (TID) | ORAL | 0 refills | Status: AC | PRN

## 2021-03-30 MED ORDER — KETOROLAC TROMETHAMINE 30 MG/ML IJ SOLN
INTRAMUSCULAR | Status: AC
  Filled 2021-03-30: qty 1

## 2021-03-30 MED ORDER — KETOROLAC TROMETHAMINE 30 MG/ML IJ SOLN
30.0000 mg | Freq: Once | INTRAMUSCULAR | Status: AC
  Administered 2021-03-30: 30 mg via INTRAMUSCULAR

## 2021-03-30 MED ORDER — ORPHENADRINE CITRATE 30 MG/ML IJ SOLN
30.0000 mg | Freq: Once | INTRAMUSCULAR | Status: AC
  Administered 2021-03-30: 30 mg via INTRAMUSCULAR

## 2021-03-30 NOTE — Progress Notes (Signed)
PROVIDER NOTE: Information contained herein reflects review and annotations entered in association with encounter. Interpretation of such information and data should be left to medically-trained personnel. Information provided to patient can be located elsewhere in the medical record under "Patient Instructions". Document created using STT-dictation technology, any transcriptional errors that may result from process are unintentional.    Patient: Cindy Rose  Service Category: E/M  Provider: Gillis Santa, MD  DOB: Nov 29, 1945  DOS: 03/30/2021  Specialty: Interventional Pain Management  MRN: [id number]  Setting: Ambulatory outpatient  PCP: Kathryne Sharper, MD  Type: Established Patient    Referring Provider: Kathryne Sharper, MD  Location: Office  Delivery: Face-to-face     HPI  [name], a 75 y.o. year old female, is here today because of her Chronic pain syndrome [G89.4]. [name] primary complain today is Shoulder Pain (right) Last encounter: My last encounter with her was on 12/28/20 Pertinent problems: [name] has Primary osteoarthritis of both knees; Chronic pain syndrome; History of bilateral knee replacement; Anxiety state; Myofascial pain; Lumbar spondylosis; Bilateral hearing loss; GERD (gastroesophageal reflux disease); Spondylosis of lumbar region without myelopathy or radiculopathy; and Lumbar degenerative disc disease on their pertinent problem list. Pain Assessment: Severity of Chronic pain is reported as a 8 /10. Location: Shoulder Right/pain radiaties down right arm. Onset: More than a month ago. Quality: Aching, Burning, Constant, Throbbing. Timing: Constant. Modifying factor(s): meds. Vitals:  height is 5\' 5"  (1.651 m) and weight is 220 lb (99.8 kg). Her temperature is 97 F (36.1 C) (abnormal). Her blood pressure is 124/81 and her pulse is 71. Her respiration is 15 and oxygen saturation is 100%.   Reason for encounter: medication management.    No change  in medical history since last visit.  Patient's pain is at baseline.  Patient continues multimodal pain regimen as prescribed.  States that it provides pain relief and improvement in functional status.   Pharmacotherapy Assessment   Analgesic: Hydrocodone 7.5 mg 3 times daily PRN, quantity 90/month, MME equals 22.5   Monitoring: [place] PMP: PDMP reviewed during this encounter.       Pharmacotherapy: No side-effects or adverse reactions reported. Compliance: No problems identified. Effectiveness: Clinically acceptable.  Chauncey Fischer, RN  03/30/2021  1:59 PM  Sign when Signing Visit Nursing Pain Medication Assessment:  Safety precautions to be maintained throughout the outpatient stay will include: orient to surroundings, keep bed in low position, maintain call bell within reach at all times, provide assistance with transfer out of bed and ambulation.  Medication Inspection Compliance: Pill count conducted under aseptic conditions, in front of the patient. Neither the pills nor the bottle was removed from the patient's sight at any time. Once count was completed pills were immediately returned to the patient in their original bottle.  Medication: Hydrocodone/APAP Pill/Patch Count:  75 of 90 pills remain Pill/Patch Appearance: Markings consistent with prescribed medication Bottle Appearance: Standard pharmacy container. Clearly labeled. Filled Date: 40 / 07 / 2022 Last Medication intake:  Today Safety precautions to be maintained throughout the outpatient stay will include: orient to surroundings, keep bed in low position, maintain call bell within reach at all times, provide assistance with transfer out of bed and ambulation.    UDS:  Summary  Date Value Ref Range Status  09/21/2020 Note  Final    Comment:    ==================================================================== [facility] (MW) ==================================================================== Test  Result       Flag       Units  Drug Present and Declared for Prescription Verification   Hydrocodone                    611          EXPECTED   ng/mg creat   Norhydrocodone                 1567         EXPECTED   ng/mg creat    Sources of hydrocodone include scheduled prescription medications.    Norhydrocodone is an expected metabolite of hydrocodone.  ==================================================================== Test                      Result    Flag   Units      Ref Range   Creatinine              27               mg/dL      >=20 ==================================================================== Declared Medications:  The flagging and interpretation on this report are based on the  following declared medications.  Unexpected results may arise from  inaccuracies in the declared medications.   **Note: The testing scope of this panel includes these medications:   Hydrocodone (Norco)   **Note: The testing scope of this panel does not include the  following reported medications:   Acetaminophen (Norco)  Baclofen (Lioresal)  Gabapentin (Neurontin)  Hydrochlorothiazide (Hydrodiuril)  Losartan (Cozaar)  Naproxen (Naprosyn)  Omeprazole (Prilosec)  Pravastatin (Pravachol)  Valsartan (Diovan) ==================================================================== For clinical consultation, please call [phone]. ====================================================================      ROS  Constitutional: Denies any fever or chills Gastrointestinal: No reported hemesis, hematochezia, vomiting, or acute GI distress Musculoskeletal:  right shoulder pain Neurological: No reported episodes of acute onset apraxia, aphasia, dysarthria, agnosia, amnesia, paralysis, loss of coordination, or loss of consciousness  Medication Review  Calcium-Magnesium-Zinc, HYDROcodone-acetaminophen, baclofen, gabapentin, hydrochlorothiazide, losartan, naproxen, omeprazole,  pravastatin, and valsartan-hydrochlorothiazide  History Review  Allergy: [name] is allergic to other, tape, zoster vaccine live, and seasonal ic [cholestatin]. Drug: [name]  reports no history of drug use. Alcohol:  reports no history of alcohol use. Tobacco:  reports that she quit smoking about 14 years ago. Her smoking use included cigarettes. She has never used smokeless tobacco. Social: [name]  reports that she quit smoking about 14 years ago. Her smoking use included cigarettes. She has never used smokeless tobacco. She reports that she does not drink alcohol and does not use drugs. Medical:  has a past medical history of Allergy, Anxiety, Arthritis, Cancer ([place]) (09/2017), Hyperlipidemia, and Hypertension. Surgical: [name]  has a past surgical history that includes Joint replacement; Abdominal hysterectomy; Wrist surgery; and Hernia repair (2021). Family: family history includes Cancer in her father; Heart disease in her mother.   Physical Exam  General appearance: Well nourished, well developed, and well hydrated. In no apparent acute distress Mental status: Alert, oriented x 3 (person, place, & time)       Respiratory: No evidence of acute respiratory distress Eyes: PERLA Vitals: BP 124/81    Pulse 71    Temp (!) 97 F (36.1 C)    Resp 15    Ht 5\' 5"  (1.651 m)    Wt 220 lb (99.8 kg)    SpO2 100%    BMI 36.61 kg/m  BMI: Estimated body mass  index is 36.61 kg/m as calculated from the following:   Height as of this encounter: 5\' 5"  (1.651 m).   Weight as of this encounter: 220 lb (99.8 kg). Ideal: Ideal body weight: 57 kg (125 lb 10.6 oz) Adjusted ideal body weight: 74.1 kg (163 lb 6.4 oz)   R shoulder pain, worse with abduction and overhead reach  Lumbar Spine Area Exam  Skin & Axial Inspection: No masses, redness, or swelling Alignment: Symmetrical Functional ROM: Pain restricted ROM       Stability: No instability detected Muscle Tone/Strength:  Functionally intact. No obvious neuro-muscular anomalies detected. Sensory (Neurological): Musculoskeletal pain pattern Palpation: Complains of area being tender to palpation         Gait & Posture Assessment  Ambulation: Unassisted Gait: Relatively normal for age and body habitus Posture: WNL  Lower Extremity Exam      Side: Right lower extremity   Side: Left lower extremity  Stability: No instability observed           Stability: No instability observed          Skin & Extremity Inspection: Skin color, temperature, and hair growth are WNL. No peripheral edema or cyanosis. No masses, redness, swelling, asymmetry, or associated skin lesions. No contractures.   Skin & Extremity Inspection: Skin color, temperature, and hair growth are WNL. No peripheral edema or cyanosis. No masses, redness, swelling, asymmetry, or associated skin lesions. No contractures.  Functional ROM: Pain restricted ROM for knee joint           Functional ROM: Pain restricted ROM for knee joint          Muscle Tone/Strength: Functionally intact. No obvious neuro-muscular anomalies detected.   Muscle Tone/Strength: Functionally intact. No obvious neuro-muscular anomalies detected.  Sensory (Neurological): Arthropathic arthralgia         Sensory (Neurological): Arthropathic arthralgia        DTR: Patellar: deferred today Achilles: deferred today Plantar: deferred today   DTR: Patellar: deferred today Achilles: deferred today Plantar: deferred today  Palpation: No palpable anomalies   Palpation: No palpable anomalies     Assessment   Status Diagnosis  Controlled Controlled Controlled 1. Chronic pain syndrome   2. Primary osteoarthritis of both knees   3. Lumbar degenerative disc disease   4. Lumbar spondylosis   5. Myofascial pain   6. 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 History of bilateral knee replacement       Plan of Care   [name] has a current medication list which  includes the following long-term medication(s): hydrochlorothiazide, losartan, omeprazole, pravastatin, valsartan-hydrochlorothiazide, and gabapentin.  Pharmacotherapy (Medications Ordered): Meds ordered this encounter  Medications   HYDROcodone-acetaminophen (NORCO) 7.5-325 MG tablet    Sig: Take 1 tablet by mouth every 8 (eight) hours as needed for severe pain. Must last 30 days.    Dispense:  90 tablet    Refill:  0    [place] STOP ACT - Not applicable. Fill one day early if pharmacy is closed on scheduled refill date.   HYDROcodone-acetaminophen (NORCO) 7.5-325 MG tablet    Sig: Take 1 tablet by mouth every 8 (eight) hours as needed for severe pain. Must last 30 days.    Dispense:  90 tablet    Refill:  0    [place] STOP ACT - Not applicable. Fill one day early if pharmacy is closed on scheduled refill date.   HYDROcodone-acetaminophen (NORCO) 7.5-325 MG tablet    Sig: Take  1 tablet by mouth every 8 (eight) hours as needed for severe pain. Must last 30 days.    Dispense:  90 tablet    Refill:  0    [place] STOP ACT - Not applicable. Fill one day early if pharmacy is closed on scheduled refill date.   gabapentin (NEURONTIN) 300 MG capsule    Sig: Take 1 capsule (300 mg total) by mouth 3 (three) times daily.    Dispense:  90 capsule    Refill:  5   ketorolac (TORADOL) 30 MG/ML injection 30 mg   orphenadrine (NORFLEX) injection 30 mg   IM Norflex/Toradol for increased shoulder pain.  Offered shoulder steroid injection but the patient wants to hold off and see how she does with Norflex Toradol.  Orders:  No orders of the defined types were placed in this encounter.  Follow-up plan:   Return in about 3 months (around 06/28/2021) for Medication Management, in person.   Recent Visits No visits were found meeting these conditions. Showing recent visits within past 90 days and meeting all other requirements Today's Visits Date Type Provider Dept  03/30/21 Office Visit Gillis Santa, MD [facility]  Showing today's visits and meeting all other requirements Future Appointments No visits were found meeting these conditions. Showing future appointments within next 90 days and meeting all other requirements I discussed the assessment and treatment plan with the patient. The patient was provided an opportunity to ask questions and all were answered. The patient agreed with the plan and demonstrated an understanding of the instructions.  Patient advised to call back or seek an in-person evaluation if the symptoms or condition worsens.  Duration of encounter: 30 minutes.  Note by: Gillis Santa, MD Date: 03/30/2021; Time: 2:56 PM

## 2021-03-30 NOTE — Progress Notes (Signed)
Nursing Pain Medication Assessment:  Safety precautions to be maintained throughout the outpatient stay will include: orient to surroundings, keep bed in low position, maintain call bell within reach at all times, provide assistance with transfer out of bed and ambulation.  Medication Inspection Compliance: Pill count conducted under aseptic conditions, in front of the patient. Neither the pills nor the bottle was removed from the patient's sight at any time. Once count was completed pills were immediately returned to the patient in their original bottle.  Medication: Hydrocodone/APAP Pill/Patch Count:  75 of 90 pills remain Pill/Patch Appearance: Markings consistent with prescribed medication Bottle Appearance: Standard pharmacy container. Clearly labeled. Filled Date: 56 / 07 / 2022 Last Medication intake:  Today Safety precautions to be maintained throughout the outpatient stay will include: orient to surroundings, keep bed in low position, maintain call bell within reach at all times, provide assistance with transfer out of bed and ambulation.

## 2021-06-21 ENCOUNTER — Other Ambulatory Visit: Payer: Self-pay | Admitting: Student in an Organized Health Care Education/Training Program

## 2021-06-21 DIAGNOSIS — G894 Chronic pain syndrome: Secondary | ICD-10-CM

## 2021-06-22 ENCOUNTER — Telehealth: Payer: Self-pay | Admitting: Student in an Organized Health Care Education/Training Program

## 2021-06-22 DIAGNOSIS — M19012 Primary osteoarthritis, left shoulder: Secondary | ICD-10-CM

## 2021-06-22 DIAGNOSIS — M19011 Primary osteoarthritis, right shoulder: Secondary | ICD-10-CM

## 2021-06-22 DIAGNOSIS — G894 Chronic pain syndrome: Secondary | ICD-10-CM

## 2021-06-22 NOTE — Telephone Encounter (Signed)
Patient's husband called asking if Cindy Rose can have injections in her shoulder on her med refill appointment date. Says Dr. Holley Rose told them she could. She is scheduled 07-06-21 for med mgmt. I do not see prn orders for should injections, only knees. Please check and advise.  ?

## 2021-06-22 NOTE — Telephone Encounter (Signed)
Dr Holley Raring, July 06, 2021 is on a Tuesday. Please let us know if we need to change days so we can do and injection.  ?

## 2021-06-23 NOTE — Telephone Encounter (Signed)
Cindy Rose she is scheduled for med mgmt on 3-21. Wants to have this procedure done that day if possible. She has [employer]  ?

## 2021-06-28 NOTE — Telephone Encounter (Signed)
OK for the procedure to be scheduled if the 21st is a procedure day ?

## 2021-06-28 NOTE — Telephone Encounter (Signed)
He is not here today. Unable to ask. Tuesday are [place] days, not sure if he told her that. ?

## 2021-06-29 NOTE — Telephone Encounter (Signed)
Dr Holley Raring, Is this true or does the secretaries need to schedule procedure on another day.? ?

## 2021-07-06 ENCOUNTER — Ambulatory Visit: Payer: Medicare HMO | Admitting: Student in an Organized Health Care Education/Training Program

## 2021-07-06 ENCOUNTER — Other Ambulatory Visit: Payer: Self-pay

## 2021-07-06 ENCOUNTER — Encounter: Payer: Self-pay | Admitting: Student in an Organized Health Care Education/Training Program

## 2021-07-06 ENCOUNTER — Ambulatory Visit
Admission: RE | Admit: 2021-07-06 | Discharge: 2021-07-06 | Disposition: A | Discharge status: Home / Self Care | Payer: Medicare HMO | Source: Ambulatory Visit | Attending: Student in an Organized Health Care Education/Training Program | Admitting: Student in an Organized Health Care Education/Training Program

## 2021-07-06 VITALS — BP 157/67 | HR 70 | Temp 97.7°F | Resp 16 | Ht 65.0 in | Wt 220.0 lb

## 2021-07-06 DIAGNOSIS — M19011 Primary osteoarthritis, right shoulder: Secondary | ICD-10-CM | POA: Insufficient documentation

## 2021-07-06 DIAGNOSIS — M19012 Primary osteoarthritis, left shoulder: Secondary | ICD-10-CM | POA: Insufficient documentation

## 2021-07-06 DIAGNOSIS — G894 Chronic pain syndrome: Secondary | ICD-10-CM

## 2021-07-06 DIAGNOSIS — G8929 Other chronic pain: Secondary | ICD-10-CM

## 2021-07-06 DIAGNOSIS — M5136 Other intervertebral disc degeneration, lumbar region: Secondary | ICD-10-CM | POA: Diagnosis not present

## 2021-07-06 DIAGNOSIS — M47816 Spondylosis without myelopathy or radiculopathy, lumbar region: Secondary | ICD-10-CM | POA: Diagnosis not present

## 2021-07-06 DIAGNOSIS — M25511 Pain in right shoulder: Secondary | ICD-10-CM

## 2021-07-06 MED ORDER — METHYLPREDNISOLONE ACETATE 40 MG/ML IJ SUSP
INTRAMUSCULAR | Status: AC
  Filled 2021-07-06: qty 1

## 2021-07-06 MED ORDER — ROPIVACAINE HCL 2 MG/ML IJ SOLN
INTRAMUSCULAR | Status: AC
  Filled 2021-07-06: qty 20

## 2021-07-06 MED ORDER — HYDROCODONE-ACETAMINOPHEN 7.5-325 MG PO TABS: 1.0000 | ORAL_TABLET | Freq: Three times a day (TID) | ORAL | 0 refills | Status: DC | PRN

## 2021-07-06 MED ORDER — LIDOCAINE HCL 2 % IJ SOLN
INTRAMUSCULAR | Status: AC
  Filled 2021-07-06: qty 20

## 2021-07-06 MED ORDER — METHYLPREDNISOLONE ACETATE 40 MG/ML IJ SUSP
40.0000 mg | Freq: Once | INTRAMUSCULAR | Status: AC
  Administered 2021-07-06: 40 mg via INTRA_ARTICULAR

## 2021-07-06 MED ORDER — ROPIVACAINE HCL 2 MG/ML IJ SOLN
4.0000 mL | Freq: Once | INTRAMUSCULAR | Status: AC
  Administered 2021-07-06: 4 mL via INTRA_ARTICULAR

## 2021-07-06 MED ORDER — LIDOCAINE HCL 2 % IJ SOLN
20.0000 mL | Freq: Once | INTRAMUSCULAR | Status: AC
  Administered 2021-07-06: 400 mg

## 2021-07-06 MED ORDER — IOHEXOL 180 MG/ML  SOLN
10.0000 mL | Freq: Once | INTRAMUSCULAR | Status: AC
  Administered 2021-07-06: 10 mL via INTRA_ARTICULAR

## 2021-07-06 MED ORDER — HYDROCODONE-ACETAMINOPHEN 7.5-325 MG PO TABS: 1.0000 | ORAL_TABLET | Freq: Three times a day (TID) | ORAL | 0 refills | Status: AC | PRN

## 2021-07-06 MED ORDER — IOHEXOL 180 MG/ML  SOLN
INTRAMUSCULAR | Status: AC
  Filled 2021-07-06: qty 20

## 2021-07-06 NOTE — Progress Notes (Signed)
Nursing Pain Medication Assessment:  ?Safety precautions to be maintained throughout the outpatient stay will include: orient to surroundings, keep bed in low position, maintain call bell within reach at all times, provide assistance with transfer out of bed and ambulation.  ?Medication Inspection Compliance: Pill count conducted under aseptic conditions, in front of the patient. Neither the pills nor the bottle was removed from the patient's sight at any time. Once count was completed pills were immediately returned to the patient in their original bottle. ? ?Medication: See above ?Pill/Patch Count: 53/90 ?Pill/Patch Appearance: Markings consistent with prescribed medication ?Bottle Appearance: Standard pharmacy container. Clearly labeled. ?Filled Date: 3 / 11/2021 ?Last Medication intake:  Today ?

## 2021-07-06 NOTE — Progress Notes (Signed)
PROVIDER NOTE: Interpretation of information contained herein should be left to medically-trained personnel. Specific patient instructions are provided elsewhere under "Patient Instructions" section of medical record. This document was created in part using STT-dictation technology, any transcriptional errors that may result from this process are unintentional.  ?Patient: Cindy Rose ?Type: Established ?DOB: [birth year] ?MRN: [id number] ?PCP: Kathryne Sharper, MD  Service: Procedure ?DOS: 07/06/2021 ?Setting: Ambulatory ?Location: Ambulatory outpatient facility ?Delivery: Face-to-face Provider: Gillis Santa, MD ?Specialty: Interventional Pain Management ?Specialty designation: 09 ?Location: Outpatient facility ?Ref. Prov.: Kathryne Sharper, MD   ? ?Primary Reason for Visit: Interventional Pain Management Treatment. ?CC: No chief complaint on file. ? ?  ?Procedure:          Anesthesia, Analgesia, Anxiolysis:  ?Type: Diagnostic Glenohumeral Joint (shoulder) Injection #1  ?Primary Purpose: Diagnostic ?Region: Superior Shoulder Area ?Level:  Shoulder ?Target Area: Glenohumeral Joint (shoulder) ?Approach: Anterior approach. ?Laterality: Right  Anesthesia: Local (1-2% Lidocaine)  ?Anxiolysis: None  ?Sedation: None  ?Guidance: Fluoroscopy         ? ? ?Position: Supine  ? ?1. Primary osteoarthritis of both shoulders   ?2. Chronic right shoulder pain   ?3. Lumbar degenerative disc disease   ?4. Lumbar spondylosis   ?5. Chronic pain syndrome   ? ?NAS-11 Pain score:  ? Pre-procedure: 7 /10  ? Post-procedure: 0-No pain (moving arm around,lifting and putting on clothes)/10  ? ?   ?Pre-op H&P Assessment:  ?Cindy Rose is a 76 y.o. (year old), female patient, seen today for interventional treatment. She  has a past surgical history that includes Joint replacement; Abdominal hysterectomy; Wrist surgery; and Hernia repair (2021). Cindy Rose has a current medication list which includes the following prescription(s): baclofen,  calcium-magnesium-zinc, gabapentin, hydrochlorothiazide, losartan, naproxen, omeprazole, pravastatin, valsartan-hydrochlorothiazide, [START ON 07/23/2021] hydrocodone-acetaminophen, [START ON 08/22/2021] hydrocodone-acetaminophen, and [START ON 09/21/2021] hydrocodone-acetaminophen. Her primarily concern today is the No chief complaint on file. ? ?Initial Vital Signs:  ?Pulse/HCG Rate: 78  ?Temp: 97.7 ?F (36.5 ?C) ?Resp: 18 ?BP: (!) 145/77 ?SpO2: 99 % ? ?BMI: Estimated body mass index is 36.61 kg/m? as calculated from the following: ?  Height as of this encounter: '5\' 5"'$  (1.651 m). ?  Weight as of this encounter: 220 lb (99.8 kg). ? ?Risk Assessment: ?Allergies: Reviewed. She is allergic to other, tape, zoster vaccine live, and seasonal ic [cholestatin].  ?Allergy Precautions: None required ?Coagulopathies: Reviewed. None identified.  ?Blood-thinner therapy: None at this time ?Active Infection(s): Reviewed. None identified. Cindy Rose is afebrile ? ?Site Confirmation: Cindy Rose was asked to confirm the procedure and laterality before marking the site ?Procedure checklist: Completed ?Consent: Before the procedure and under the influence of no sedative(s), amnesic(s), or anxiolytics, the patient was informed of the treatment options, risks and possible complications. To fulfill our ethical and legal obligations, as recommended by the American Medical Association's Code of Ethics, I have informed the patient of my clinical impression; the nature and purpose of the treatment or procedure; the risks, benefits, and possible complications of the intervention; the alternatives, including doing nothing; the risk(s) and benefit(s) of the alternative treatment(s) or procedure(s); and the risk(s) and benefit(s) of doing nothing. ?The patient was provided information about the general risks and possible complications associated with the procedure. These may include, but are not limited to: failure to achieve desired goals,  infection, bleeding, organ or nerve damage, allergic reactions, paralysis, and death. ?In addition, the patient was informed of those risks and complications associated to the procedure, such as failure to decrease  pain; infection; bleeding; organ or nerve damage with subsequent damage to sensory, motor, and/or autonomic systems, resulting in permanent pain, numbness, and/or weakness of one or several areas of the body; allergic reactions; (i.e.: anaphylactic reaction); and/or death. ?Furthermore, the patient was informed of those risks and complications associated with the medications. These include, but are not limited to: allergic reactions (i.e.: anaphylactic or anaphylactoid reaction(s)); adrenal axis suppression; blood sugar elevation that in diabetics may result in ketoacidosis or comma; water retention that in patients with history of congestive heart failure may result in shortness of breath, pulmonary edema, and decompensation with resultant heart failure; weight gain; swelling or edema; medication-induced neural toxicity; particulate matter embolism and blood vessel occlusion with resultant organ, and/or nervous system infarction; and/or aseptic necrosis of one or more joints. ?Finally, the patient was informed that Medicine is not an exact science; therefore, there is also the possibility of unforeseen or unpredictable risks and/or possible complications that may result in a catastrophic outcome. The patient indicated having understood very clearly. We have given the patient no guarantees and we have made no promises. Enough time was given to the patient to ask questions, all of which were answered to the patient's satisfaction. Cindy Rose has indicated that she wanted to continue with the procedure. ?Attestation: I, the ordering provider, attest that I have discussed with the patient the benefits, risks, side-effects, alternatives, likelihood of achieving goals, and potential problems during recovery  for the procedure that I have provided informed consent. ?Date  Time: 07/06/2021  1:30 PM ? ?Pre-Procedure Preparation:  ?Monitoring: As per clinic protocol. Respiration, ETCO2, SpO2, BP, heart rate and rhythm monitor placed and checked for adequate function ?Safety Precautions: Patient was assessed for positional comfort and pressure points before starting the procedure. ?Time-out: I initiated and conducted the "Time-out" before starting the procedure, as per protocol. The patient was asked to participate by confirming the accuracy of the "Time Out" information. Verification of the correct person, site, and procedure were performed and confirmed by me, the nursing staff, and the patient. "Time-out" conducted as per Joint Commission's Universal Protocol (UP.01.01.01). ?Time: 1406 ? ?Description of Procedure:          ?Area Prepped: Entire shoulder Area ?DuraPrep (Iodine Povacrylex [0.7% available iodine] and Isopropyl Alcohol, 74% w/w) ?Safety Precautions: Aspiration looking for blood return was conducted prior to all injections. At no point did we inject any substances, as a needle was being advanced. No attempts were made at seeking any paresthesias. Safe injection practices and needle disposal techniques used. Medications properly checked for expiration dates. SDV (single dose vial) medications used. ?Description of the Procedure: Protocol guidelines were followed. The patient was placed in position over the procedure table. The target area was identified and the area prepped in the usual manner. Skin & deeper tissues infiltrated with local anesthetic. Appropriate amount of time allowed to pass for local anesthetics to take effect. The procedure needles were then advanced to the target area. Proper needle placement secured. Negative aspiration confirmed. Solution injected in intermittent fashion, asking for systemic symptoms every 0.5cc of injectate. The needles were then removed and the area cleansed, making  sure to leave some of the prepping solution back to take advantage of its long term bactericidal properties. ? ?      ? ?Vitals:  ? 07/06/21 1334 07/06/21 1400 07/06/21 1410 07/06/21 1415  ?BP: (!) 145/77 (!) 156

## 2021-07-06 NOTE — Progress Notes (Signed)
PROVIDER NOTE: Information contained herein reflects review and annotations entered in association with encounter. Interpretation of such information and data should be left to medically-trained personnel. Information provided to patient can be located elsewhere in the medical record under "Patient Instructions". Document created using STT-dictation technology, any transcriptional errors that may result from process are unintentional.  ?  ?Patient: Cindy Rose  Service Category: E/M  Provider: Gillis Santa, MD  ?DOB: May 03, 1945  DOS: 07/06/2021  Specialty: Interventional Pain Management  ?MRN: [id number]  Setting: Ambulatory outpatient  PCP: Kathryne Sharper, MD  ?Type: Established Patient    Referring Provider: Kathryne Sharper, MD  ?Location: Office  Delivery: Face-to-face    ? ?HPI  ?[name], a 76 y.o. year old female, is here today because of her Primary osteoarthritis of both shoulders [M19.011, M19.012]. [name]'s primary complain today is No chief complaint on file. ? ?Last encounter: My last encounter with her was on 03/30/21 ?Pertinent problems: [name] has Primary osteoarthritis of both knees; Chronic pain syndrome; History of bilateral knee replacement; Anxiety state; Myofascial pain; Lumbar spondylosis; Bilateral hearing loss; GERD (gastroesophageal reflux disease); Spondylosis of lumbar region without myelopathy or radiculopathy; and Lumbar degenerative disc disease on their pertinent problem list. ?Pain Assessment: Severity of Chronic pain is reported as a 7 /10. Location: Shoulder Right/down right arm to elbow. Onset: More than a month ago. Quality: Aching, Sharp (Feels like a toothache). Timing: Constant. Modifying factor(s): procedures. ?Vitals:  height is '5\' 5"'$  (1.651 m) and weight is 220 lb (99.8 kg). Her temporal temperature is 97.7 ?F (36.5 ?C). Her blood pressure is 157/67 (abnormal) and her pulse is 70. Her respiration is 16 and oxygen saturation is 99%.  ? ?Reason for  encounter: medication management.   ? ?No change in medical history since last visit.  Presents today for medication refill of her hydrocodone as well as right shoulder steroid injection for shoulder osteoarthritis. ?No change in dose in her chronic pain medications. ? ? ?Pharmacotherapy Assessment  ? ?Analgesic: Hydrocodone 7.5 mg 3 times daily PRN, quantity 90/month, MME equals 22.5  ? ?Monitoring: ?[place] PMP: PDMP not reviewed this encounter.       ?Pharmacotherapy: No side-effects or adverse reactions reported. ?Compliance: No problems identified. ?Effectiveness: Clinically acceptable. ? ?[name], RN  07/06/2021  1:47 PM  Sign when Signing Visit ?Nursing Pain Medication Assessment:  ?Safety precautions to be maintained throughout the outpatient stay will include: orient to surroundings, keep bed in low position, maintain call bell within reach at all times, provide assistance with transfer out of bed and ambulation.  ?Medication Inspection Compliance: Pill count conducted under aseptic conditions, in front of the patient. Neither the pills nor the bottle was removed from the patient's sight at any time. Once count was completed pills were immediately returned to the patient in their original bottle. ? ?Medication: See above ?Pill/Patch Count: 53/90 ?Pill/Patch Appearance: Markings consistent with prescribed medication ?Bottle Appearance: Standard pharmacy container. Clearly labeled. ?Filled Date: 3 / 11/2021 ?Last Medication intake:  Today ?  UDS:  ?Summary  ?Date Value Ref Range Status  ?09/21/2020 Note  Final  ?  Comment:  ?  ==================================================================== ?[employer] Select 13 (MW) ?==================================================================== ?Test                             Result       Flag       Units ? ?Drug Present and Declared for Prescription Verification ?  Hydrocodone                    611          EXPECTED   ng/mg creat ?  Norhydrocodone                  1567         EXPECTED   ng/mg creat ?   Sources of hydrocodone include scheduled prescription medications. ?   Norhydrocodone is an expected metabolite of hydrocodone. ? ?==================================================================== ?Test                      Result    Flag   Units      Ref Range ?  Creatinine              27               mg/dL      >=20 ?==================================================================== ?Declared Medications: ? The flagging and interpretation on this report are based on the ? following declared medications.  Unexpected results may arise from ? inaccuracies in the declared medications. ? ? **Note: The testing scope of this panel includes these medications: ? ? Hydrocodone (Norco) ? ? **Note: The testing scope of this panel does not include the ? following reported medications: ? ? Acetaminophen (Norco) ? Baclofen (Lioresal) ? Gabapentin (Neurontin) ? Hydrochlorothiazide (Hydrodiuril) ? Losartan (Cozaar) ? Naproxen (Naprosyn) ? Omeprazole (Prilosec) ? Pravastatin (Pravachol) ? Valsartan (Diovan) ?==================================================================== ?For clinical consultation, please call [phone]. ?==================================================================== ?  ?  ? ?ROS  ?Constitutional: Denies any fever or chills ?Gastrointestinal: No reported hemesis, hematochezia, vomiting, or acute GI distress ?Musculoskeletal:  right shoulder pain ?Neurological: No reported episodes of acute onset apraxia, aphasia, dysarthria, agnosia, amnesia, paralysis, loss of coordination, or loss of consciousness ? ?Medication Review  ?Calcium-Magnesium-Zinc, HYDROcodone-acetaminophen, baclofen, gabapentin, hydrochlorothiazide, losartan, naproxen, omeprazole, pravastatin, and valsartan-hydrochlorothiazide ? ?History Review  ?Allergy: [name] is allergic to other, tape, zoster vaccine live, and seasonal ic [cholestatin]. ?Drug: [name]  reports no  history of drug use. ?Alcohol:  reports no history of alcohol use. ?Tobacco:  reports that she quit smoking about 14 years ago. Her smoking use included cigarettes. She has never used smokeless tobacco. ?Social: Ms. Couvillon  reports that she quit smoking about 14 years ago. Her smoking use included cigarettes. She has never used smokeless tobacco. She reports that she does not drink alcohol and does not use drugs. ?Medical:  has a past medical history of Allergy, Anxiety, Arthritis, Cancer ([place]) (09/2017), Hyperlipidemia, and Hypertension. ?Surgical: Ms. Faiella  has a past surgical history that includes Joint replacement; Abdominal hysterectomy; Wrist surgery; and Hernia repair (2021). ?Family: family history includes Cancer in her father; Heart disease in her mother. ? ? ?Physical Exam  ?General appearance: Well nourished, well developed, and well hydrated. In no apparent acute distress ?Mental status: Alert, oriented x 3 (person, place, & time)       ?Respiratory: No evidence of acute respiratory distress ?Eyes: PERLA ?Vitals: BP (!) 157/67   Pulse 70   Temp 97.7 ?F (36.5 ?C) (Temporal)   Resp 16   Ht '5\' 5"'$  (1.651 m)   Wt 220 lb (99.8 kg)   SpO2 99%   BMI 36.61 kg/m?  ?BMI: Estimated body mass index is 36.61 kg/m? as calculated from the following: ?  Height as of this encounter: '5\' 5"'$  (1.651 m). ?  Weight as of this encounter: 220 lb (99.8  kg). ?Ideal: Ideal body weight: 57 kg (125 lb 10.6 oz) ?Adjusted ideal body weight: 74.1 kg (163 lb 6.4 oz)  ? ?R shoulder pain, worse with abduction and overhead reach ? ?Lumbar Spine Area Exam  ?Skin & Axial Inspection: No masses, redness, or swelling ?Alignment: Symmetrical ?Functional ROM: Pain restricted ROM       ?Stability: No instability detected ?Muscle Tone/Strength: Functionally intact. No obvious neuro-muscular anomalies detected. ?Sensory (Neurological): Musculoskeletal pain pattern ?Palpation: Complains of area being tender to palpation       ?  ?Gait &  Posture Assessment  ?Ambulation: Unassisted ?Gait: Relatively normal for age and body habitus ?Posture: WNL  ?Lower Extremity Exam      ?Side: Right lower extremity   Side: Left lower extremity  ?Stability: N

## 2021-07-06 NOTE — Patient Instructions (Signed)

## 2021-10-04 ENCOUNTER — Telehealth: Payer: Self-pay | Admitting: Student in an Organized Health Care Education/Training Program

## 2021-10-04 NOTE — Telephone Encounter (Signed)
Husband is calling about Cindy Rose having some kind of shot (like she did the last visit). Says Dr. Holley Raring told them to call about a week ahead of time and he would set it up so she could get this on the day of her MM appt. Not sure if it is a Norflex/ Toradol, or other injections so will send to [facility] also.

## 2021-10-04 NOTE — Telephone Encounter (Signed)
Attempted to reach patient, voicemail left.  Attempted to reach husband.

## 2021-10-10 ENCOUNTER — Other Ambulatory Visit: Payer: Self-pay | Admitting: Student in an Organized Health Care Education/Training Program

## 2021-10-12 ENCOUNTER — Encounter: Payer: Self-pay | Admitting: Student in an Organized Health Care Education/Training Program

## 2021-10-12 ENCOUNTER — Ambulatory Visit
Payer: Medicare HMO | Attending: Student in an Organized Health Care Education/Training Program | Admitting: Student in an Organized Health Care Education/Training Program

## 2021-10-12 VITALS — BP 136/68 | HR 69 | Temp 97.1°F | Resp 16 | Ht 65.0 in | Wt 220.0 lb

## 2021-10-12 DIAGNOSIS — M25511 Pain in right shoulder: Secondary | ICD-10-CM | POA: Insufficient documentation

## 2021-10-12 DIAGNOSIS — G894 Chronic pain syndrome: Secondary | ICD-10-CM | POA: Insufficient documentation

## 2021-10-12 DIAGNOSIS — M47816 Spondylosis without myelopathy or radiculopathy, lumbar region: Secondary | ICD-10-CM | POA: Insufficient documentation

## 2021-10-12 DIAGNOSIS — M19011 Primary osteoarthritis, right shoulder: Secondary | ICD-10-CM | POA: Insufficient documentation

## 2021-10-12 DIAGNOSIS — M19012 Primary osteoarthritis, left shoulder: Secondary | ICD-10-CM | POA: Insufficient documentation

## 2021-10-12 DIAGNOSIS — M5136 Other intervertebral disc degeneration, lumbar region: Secondary | ICD-10-CM | POA: Insufficient documentation

## 2021-10-12 DIAGNOSIS — G8929 Other chronic pain: Secondary | ICD-10-CM | POA: Insufficient documentation

## 2021-10-12 MED ORDER — GABAPENTIN 300 MG PO CAPS: 300.0000 mg | ORAL_CAPSULE | Freq: Three times a day (TID) | ORAL | 5 refills | Status: DC

## 2021-10-12 MED ORDER — BACLOFEN 10 MG PO TABS: 10.0000 mg | ORAL_TABLET | Freq: Two times a day (BID) | ORAL | 5 refills | Status: DC | PRN

## 2021-10-12 MED ORDER — HYDROCODONE-ACETAMINOPHEN 7.5-325 MG PO TABS: 1.0000 | ORAL_TABLET | Freq: Three times a day (TID) | ORAL | 0 refills | Status: DC | PRN

## 2021-10-12 MED ORDER — HYDROCODONE-ACETAMINOPHEN 7.5-325 MG PO TABS: 1.0000 | ORAL_TABLET | Freq: Three times a day (TID) | ORAL | 0 refills | Status: AC | PRN

## 2021-12-23 ENCOUNTER — Other Ambulatory Visit: Payer: Self-pay | Admitting: Student in an Organized Health Care Education/Training Program

## 2021-12-23 DIAGNOSIS — M19011 Primary osteoarthritis, right shoulder: Secondary | ICD-10-CM

## 2021-12-23 DIAGNOSIS — G8929 Other chronic pain: Secondary | ICD-10-CM

## 2021-12-23 NOTE — Progress Notes (Unsigned)
Orders Placed This Encounter  Procedures   SHOULDER INJECTION    Standing Status:   Future    Standing Expiration Date:   03/24/2022    Scheduling Instructions:     Procedure: Intra-articular shoulder (Glenohumeral) joint injection     RIGHT    Order Specific Question:   Where will this procedure be performed?    Answer:   [facility]

## 2022-01-11 ENCOUNTER — Ambulatory Visit
Payer: Medicare HMO | Attending: Student in an Organized Health Care Education/Training Program | Admitting: Student in an Organized Health Care Education/Training Program

## 2022-01-11 ENCOUNTER — Encounter: Payer: Self-pay | Admitting: Student in an Organized Health Care Education/Training Program

## 2022-01-11 VITALS — BP 139/100 | HR 66 | Temp 97.0°F | Resp 16 | Ht 65.0 in | Wt 223.0 lb

## 2022-01-11 DIAGNOSIS — M5136 Other intervertebral disc degeneration, lumbar region: Secondary | ICD-10-CM | POA: Insufficient documentation

## 2022-01-11 DIAGNOSIS — G8929 Other chronic pain: Secondary | ICD-10-CM | POA: Diagnosis present

## 2022-01-11 DIAGNOSIS — G894 Chronic pain syndrome: Secondary | ICD-10-CM | POA: Insufficient documentation

## 2022-01-11 DIAGNOSIS — M19012 Primary osteoarthritis, left shoulder: Secondary | ICD-10-CM | POA: Diagnosis present

## 2022-01-11 DIAGNOSIS — M19011 Primary osteoarthritis, right shoulder: Secondary | ICD-10-CM | POA: Diagnosis not present

## 2022-01-11 DIAGNOSIS — M25511 Pain in right shoulder: Secondary | ICD-10-CM | POA: Insufficient documentation

## 2022-01-11 DIAGNOSIS — M47816 Spondylosis without myelopathy or radiculopathy, lumbar region: Secondary | ICD-10-CM | POA: Insufficient documentation

## 2022-01-11 MED ORDER — HYDROCODONE-ACETAMINOPHEN 7.5-325 MG PO TABS: 1.0000 | ORAL_TABLET | Freq: Three times a day (TID) | ORAL | 0 refills | Status: AC | PRN

## 2022-01-11 NOTE — Progress Notes (Signed)
PROVIDER NOTE: Information contained herein reflects review and annotations entered in association with encounter. Interpretation of such information and data should be left to medically-trained personnel. Information provided to patient can be located elsewhere in the medical record under "Patient Instructions". Document created using STT-dictation technology, any transcriptional errors that may result from process are unintentional.    Patient: Cindy Rose  Service Category: E/M  Provider: Gillis Santa, MD  DOB: [birth year]  DOS: 01/11/2022  Specialty: Interventional Pain Management  MRN: [id number]  Setting: Ambulatory outpatient  PCP: Kathryne Sharper, MD  Type: Established Patient    Referring Provider: Kathryne Sharper, MD  Location: Office  Delivery: Face-to-face     HPI  [name], a 76 y.o. year old female, is here today because of her Chronic right shoulder pain [M25.511, G89.29]. Ms. Noy primary complain today is Back Pain (Knees ddown to feet)  Last encounter: My last encounter with her was on 10/12/2021 Pertinent problems: Ms. Wellborn has Primary osteoarthritis of both knees; Chronic pain syndrome; History of bilateral knee replacement; Anxiety state; Myofascial pain; Lumbar spondylosis; Bilateral hearing loss; GERD (gastroesophageal reflux disease); Spondylosis of lumbar region without myelopathy or radiculopathy; and Lumbar degenerative disc disease on their pertinent problem list. Pain Assessment: Severity of Chronic pain is reported as a 6 /10. Location: Back Lower/radiates down to right hip. Onset: More than a month ago. Quality: [place] Ard, Aching. Timing: Constant. Modifying factor(s): meds. Vitals:  height is '5\' 5"'$  (1.651 m) and weight is 223 lb (101.2 kg). Her temperature is 97 F (36.1 C) (abnormal). Her blood pressure is 139/100 (abnormal) and her pulse is 66. Her respiration is 16 and oxygen saturation is 98%.   Reason for encounter: medication management.     No change in medical history since last visit.  Presents today for medication refill of her hydrocodone as well as requesting a repeat right shoulder steroid injection for shoulder osteoarthritis.  She has chronic shoulder pain related to shoulder osteoarthritis.  Her previous glenohumeral joint injection was performed 07/06/2021 which provided her with approximately 75% pain relief for about 3 to 4 months.  She has increased pain with shoulder abduction.  No change in dose in her chronic pain medications.    Pharmacotherapy Assessment  Analgesic: Hydrocodone 7.5 mg 3 times daily PRN, quantity 90/month, MME equals 22.5   Monitoring: [place] PMP: PDMP reviewed during this encounter.       Pharmacotherapy: No side-effects or adverse reactions reported. Compliance: No problems identified. Effectiveness: Clinically acceptable.  UDS:  Summary  Date Value Ref Range Status  09/21/2020 Note  Final    Comment:    ==================================================================== [employer] Select 13 (MW) ==================================================================== Test                             Result       Flag       Units  Drug Present and Declared for Prescription Verification   Hydrocodone                    611          EXPECTED   ng/mg creat   Norhydrocodone                 1567         EXPECTED   ng/mg creat    Sources of hydrocodone include scheduled prescription medications.    Norhydrocodone is an expected metabolite of hydrocodone.  ====================================================================  Test                      Result    Flag   Units      Ref Range   Creatinine              27               mg/dL      >=20 ==================================================================== Declared Medications:  The flagging and interpretation on this report are based on the  following declared medications.  Unexpected results may arise from  inaccuracies in the declared  medications.   **Note: The testing scope of this panel includes these medications:   Hydrocodone (Norco)   **Note: The testing scope of this panel does not include the  following reported medications:   Acetaminophen (Norco)  Baclofen (Lioresal)  Gabapentin (Neurontin)  Hydrochlorothiazide (Hydrodiuril)  Losartan (Cozaar)  Naproxen (Naprosyn)  Omeprazole (Prilosec)  Pravastatin (Pravachol)  Valsartan (Diovan) ==================================================================== For clinical consultation, please call [phone]. ====================================================================       ROS  Constitutional: Denies any fever or chills Gastrointestinal: No reported hemesis, hematochezia, vomiting, or acute GI distress Musculoskeletal:  right shoulder pain Neurological: No reported episodes of acute onset apraxia, aphasia, dysarthria, agnosia, amnesia, paralysis, loss of coordination, or loss of consciousness  Medication Review  Calcium-Magnesium-Zinc, HYDROcodone-acetaminophen, baclofen, gabapentin, hydrochlorothiazide, losartan, naproxen, omeprazole, pravastatin, and valsartan-hydrochlorothiazide  History Review  Allergy: [name] is allergic to other, tape, zoster vaccine live, and seasonal ic [cholestatin]. Drug: [name]  reports no history of drug use. Alcohol:  reports no history of alcohol use. Tobacco:  reports that she quit smoking about 15 years ago. Her smoking use included cigarettes. She has never used smokeless tobacco. Social: [name]  reports that she quit smoking about 15 years ago. Her smoking use included cigarettes. She has never used smokeless tobacco. She reports that she does not drink alcohol and does not use drugs. Medical:  has a past medical history of Allergy, Anxiety, Arthritis, Cancer ([place]) (09/2017), Hyperlipidemia, and Hypertension. Surgical: [name]  has a past surgical history that includes Joint  replacement; Abdominal hysterectomy; Wrist surgery; and Hernia repair (2021). Family: family history includes Cancer in her father; Heart disease in her mother.   Physical Exam  General appearance: Well nourished, well developed, and well hydrated. In no apparent acute distress Mental status: Alert, oriented x 3 (person, place, & time)       Respiratory: No evidence of acute respiratory distress Eyes: PERLA Vitals: BP (!) 139/100   Pulse 66   Temp (!) 97 F (36.1 C)   Resp 16   Ht '5\' 5"'$  (1.651 m)   Wt 223 lb (101.2 kg)   SpO2 98%   BMI 37.11 kg/m  BMI: Estimated body mass index is 37.11 kg/m as calculated from the following:   Height as of this encounter: '5\' 5"'$  (1.651 m).   Weight as of this encounter: 223 lb (101.2 kg). Ideal: Ideal body weight: 57 kg (125 lb 10.6 oz) Adjusted ideal body weight: 74.7 kg (164 lb 9.6 oz)   R shoulder pain, worse with abduction and overhead reach  Lumbar Spine Area Exam  Skin & Axial Inspection: No masses, redness, or swelling Alignment: Symmetrical Functional ROM: Pain restricted ROM       Stability: No instability detected Muscle Tone/Strength: Functionally intact. No obvious neuro-muscular anomalies detected. Sensory (Neurological): Musculoskeletal pain pattern Palpation: Complains of area being tender to palpation  Gait & Posture Assessment  Ambulation: Unassisted Gait: Relatively normal for age and body habitus Posture: WNL  Lower Extremity Exam      Side: Right lower extremity   Side: Left lower extremity  Stability: No instability observed           Stability: No instability observed          Skin & Extremity Inspection: Skin color, temperature, and hair growth are WNL. No peripheral edema or cyanosis. No masses, redness, swelling, asymmetry, or associated skin lesions. No contractures.   Skin & Extremity Inspection: Skin color, temperature, and hair growth are WNL. No peripheral edema or cyanosis. No masses, redness, swelling,  asymmetry, or associated skin lesions. No contractures.  Functional ROM: Pain restricted ROM for knee joint           Functional ROM: Pain restricted ROM for knee joint          Muscle Tone/Strength: Functionally intact. No obvious neuro-muscular anomalies detected.   Muscle Tone/Strength: Functionally intact. No obvious neuro-muscular anomalies detected.  Sensory (Neurological): Arthropathic arthralgia         Sensory (Neurological): Arthropathic arthralgia        DTR: Patellar: deferred today Achilles: deferred today Plantar: deferred today   DTR: Patellar: deferred today Achilles: deferred today Plantar: deferred today  Palpation: No palpable anomalies   Palpation: No palpable anomalies     Assessment   Status Diagnosis  Controlled Controlled Controlled 1. Chronic right shoulder pain   2. Primary osteoarthritis of both shoulders   3. Lumbar degenerative disc disease   4. Lumbar spondylosis   5. Chronic pain syndrome       Plan of Care   Ms. Magdelena Kinsella has a current medication list which includes the following long-term medication(s): gabapentin, hydrochlorothiazide, losartan, omeprazole, pravastatin, and valsartan-hydrochlorothiazide.  Pharmacotherapy (Medications Ordered): Meds ordered this encounter  Medications   HYDROcodone-acetaminophen (NORCO) 7.5-325 MG tablet    Sig: Take 1 tablet by mouth every 8 (eight) hours as needed for severe pain. Must last 30 days.    Dispense:  90 tablet    Refill:  0    [place] STOP ACT - Not applicable. Fill one day early if pharmacy is closed on scheduled refill date.   HYDROcodone-acetaminophen (NORCO) 7.5-325 MG tablet    Sig: Take 1 tablet by mouth every 8 (eight) hours as needed for severe pain. Must last 30 days.    Dispense:  90 tablet    Refill:  0    [place] STOP ACT - Not applicable. Fill one day early if pharmacy is closed on scheduled refill date.   HYDROcodone-acetaminophen (NORCO) 7.5-325 MG tablet    Sig: Take 1 tablet  by mouth every 8 (eight) hours as needed for severe pain. Must last 30 days.    Dispense:  90 tablet    Refill:  0    [place] STOP ACT - Not applicable. Fill one day early if pharmacy is closed on scheduled refill date.    Orders:  Orders Placed This Encounter  Procedures   SHOULDER INJECTION    Standing Status:   Future    Standing Expiration Date:   04/12/2022    Scheduling Instructions:     Procedure: Intra-articular shoulder (Glenohumeral) joint injection     Side: RIGHT     Level: Glenohumeral joint               Without sedation    Order Specific Question:   Where will this procedure  be performed?    Answer:   [facility]   Tox[employer]SURE Select 13 (MW), Urine    Volume: 30 ml(s). Minimum 3 ml of urine is needed. Document temperature of fresh sample. Indications: Long term (current) use of opiate analgesic (X45.859)    Order Specific Question:   Release to patient    Answer:   Immediate    Follow-up plan:   Return in about 3 months (around 04/12/2022) for MM + RIGHT SHOULDER injection.   Recent Visits No visits were found meeting these conditions. Showing recent visits within past 90 days and meeting all other requirements Today's Visits Date Type Provider Dept  01/11/22 Office Visit Gillis Santa, MD [facility]  Showing today's visits and meeting all other requirements Future Appointments No visits were found meeting these conditions. Showing future appointments within next 90 days and meeting all other requirements  I discussed the assessment and treatment plan with the patient. The patient was provided an opportunity to ask questions and all were answered. The patient agreed with the plan and demonstrated an understanding of the instructions.  Patient advised to call back or seek an in-person evaluation if the symptoms or condition worsens.  Duration of encounter: 30 minutes.  Note by: Gillis Santa, MD Date: 01/11/2022; Time: 2:27 PM

## 2022-01-11 NOTE — Progress Notes (Signed)
Nursing Pain Medication Assessment:  Safety precautions to be maintained throughout the outpatient stay will include: orient to surroundings, keep bed in low position, maintain call bell within reach at all times, provide assistance with transfer out of bed and ambulation.  Medication Inspection Compliance: Pill count conducted under aseptic conditions, in front of the patient. Neither the pills nor the bottle was removed from the patient's sight at any time. Once count was completed pills were immediately returned to the patient in their original bottle.  Medication: Hydrocodone/APAP Pill/Patch Count:  27 of 90 pills remain Pill/Patch Appearance: Markings consistent with prescribed medication Bottle Appearance: Standard pharmacy container. Clearly labeled. Filled Date: 09 / 2 / 2023 Last Medication intake:  Today

## 2022-01-14 LAB — TOXASSURE SELECT 13 (MW), URINE

## 2022-04-12 ENCOUNTER — Encounter: Payer: Medicare HMO | Admitting: Student in an Organized Health Care Education/Training Program

## 2022-04-19 ENCOUNTER — Encounter: Payer: Self-pay | Admitting: Student in an Organized Health Care Education/Training Program

## 2022-04-19 ENCOUNTER — Ambulatory Visit
Admission: RE | Admit: 2022-04-19 | Discharge: 2022-04-19 | Disposition: A | Discharge status: Home / Self Care | Payer: Medicare HMO | Source: Ambulatory Visit | Attending: Student in an Organized Health Care Education/Training Program | Admitting: Student in an Organized Health Care Education/Training Program

## 2022-04-19 ENCOUNTER — Ambulatory Visit
Payer: Medicare HMO | Attending: Student in an Organized Health Care Education/Training Program | Admitting: Student in an Organized Health Care Education/Training Program

## 2022-04-19 VITALS — BP 127/61 | HR 74 | Temp 97.2°F | Resp 16 | Ht 64.0 in | Wt 220.0 lb

## 2022-04-19 DIAGNOSIS — M25511 Pain in right shoulder: Secondary | ICD-10-CM | POA: Insufficient documentation

## 2022-04-19 DIAGNOSIS — M19011 Primary osteoarthritis, right shoulder: Secondary | ICD-10-CM | POA: Diagnosis present

## 2022-04-19 DIAGNOSIS — M5136 Other intervertebral disc degeneration, lumbar region: Secondary | ICD-10-CM | POA: Diagnosis present

## 2022-04-19 DIAGNOSIS — M19012 Primary osteoarthritis, left shoulder: Secondary | ICD-10-CM | POA: Diagnosis present

## 2022-04-19 DIAGNOSIS — G894 Chronic pain syndrome: Secondary | ICD-10-CM | POA: Diagnosis present

## 2022-04-19 DIAGNOSIS — G8929 Other chronic pain: Secondary | ICD-10-CM | POA: Diagnosis present

## 2022-04-19 MED ORDER — HYDROCODONE-ACETAMINOPHEN 7.5-325 MG PO TABS: 1.0000 | ORAL_TABLET | Freq: Three times a day (TID) | ORAL | 0 refills | Status: AC | PRN

## 2022-04-19 MED ORDER — ROPIVACAINE HCL 2 MG/ML IJ SOLN
4.0000 mL | Freq: Once | INTRAMUSCULAR | Status: AC
  Administered 2022-04-19: 4 mL via INTRA_ARTICULAR
  Filled 2022-04-19: qty 20

## 2022-04-19 MED ORDER — HYDROCODONE-ACETAMINOPHEN 7.5-325 MG PO TABS: 1.0000 | ORAL_TABLET | Freq: Three times a day (TID) | ORAL | 0 refills | Status: DC | PRN

## 2022-04-19 MED ORDER — IOHEXOL 180 MG/ML  SOLN
10.0000 mL | Freq: Once | INTRAMUSCULAR | Status: AC
  Administered 2022-04-19: 10 mL via INTRA_ARTICULAR
  Filled 2022-04-19: qty 20

## 2022-04-19 MED ORDER — BACLOFEN 10 MG PO TABS: 10.0000 mg | ORAL_TABLET | Freq: Two times a day (BID) | ORAL | 5 refills | Status: DC | PRN

## 2022-04-19 MED ORDER — LIDOCAINE HCL 2 % IJ SOLN
20.0000 mL | Freq: Once | INTRAMUSCULAR | Status: AC
  Administered 2022-04-19: 100 mg
  Filled 2022-04-19: qty 20

## 2022-04-19 MED ORDER — GABAPENTIN 300 MG PO CAPS: 300.0000 mg | ORAL_CAPSULE | Freq: Three times a day (TID) | ORAL | 5 refills | Status: DC

## 2022-04-19 MED ORDER — METHYLPREDNISOLONE ACETATE 40 MG/ML IJ SUSP
40.0000 mg | Freq: Once | INTRAMUSCULAR | Status: AC
  Administered 2022-04-19: 40 mg via INTRA_ARTICULAR
  Filled 2022-04-19: qty 1

## 2022-04-19 NOTE — Patient Instructions (Signed)

## 2022-04-19 NOTE — Progress Notes (Signed)
Nursing Pain Medication Assessment:  Safety precautions to be maintained throughout the outpatient stay will include: orient to surroundings, keep bed in low position, maintain call bell within reach at all times, provide assistance with transfer out of bed and ambulation.  Medication Inspection Compliance: Pill count conducted under aseptic conditions, in front of the patient. Neither the pills nor the bottle was removed from the patient's sight at any time. Once count was completed pills were immediately returned to the patient in their original bottle.  Medication: Hydrocodone/APAP Pill/Patch Count:  9 of 90 pills remain Pill/Patch Appearance: Markings consistent with prescribed medication Bottle Appearance: Standard pharmacy container. Clearly labeled. Filled Date: 31 / 01 / 2023 Last Medication intake:  Today

## 2022-04-19 NOTE — Progress Notes (Signed)
PROVIDER NOTE: Information contained herein reflects review and annotations entered in association with encounter. Interpretation of such information and data should be left to medically-trained personnel. Information provided to patient can be located elsewhere in the medical record under "Patient Instructions". Document created using STT-dictation technology, any transcriptional errors that may result from process are unintentional.    Patient: Cindy Rose  Service Category: E/M  Provider: Gillis Santa, MD  DOB: December 21, 1945  DOS: 04/19/2022  Specialty: Interventional Pain Management  MRN: [id number]  Setting: Ambulatory outpatient  PCP: Kathryne Sharper, MD  Type: Established Patient    Referring Provider: Kathryne Sharper, MD  Location: Office  Delivery: Face-to-face     HPI  [name], a 77 y.o. year old female, is here today because of her Chronic right shoulder pain [M25.511, G89.29]. Ms. Henry's primary complain today is Shoulder Pain (Right ), Knee Pain (Bilateral ), and Back Pain (Entire back )  Last encounter: My last encounter with her was on 03/30/21 Pertinent problems: Ms. Delany has Primary osteoarthritis of both knees; Chronic pain syndrome; History of bilateral knee replacement; Anxiety state; Myofascial pain; Lumbar spondylosis; Bilateral hearing loss; GERD (gastroesophageal reflux disease); Spondylosis of lumbar region without myelopathy or radiculopathy; and Lumbar degenerative disc disease on their pertinent problem list. Pain Assessment: Severity of Chronic pain is reported as a 6 /10. Location: Shoulder (see visit info for additional pain sites.) Right/shoulder pain down the right arm. Onset: More than a month ago. Quality: Discomfort, Constant, Sharp, Shooting (electricity). Timing: Constant. Modifying factor(s): ice, medications, topical analgesia. Vitals:  height is '5\' 4"'$  (1.626 m) and weight is 220 lb (99.8 kg). Her temporal temperature is 97.2 F (36.2 C)  (abnormal). Her blood pressure is 127/61 and her pulse is 74. Her respiration is 16 and oxygen saturation is 100%.   Reason for encounter: medication management.    No change in medical history since last visit.  Presents today for medication refill of her hydrocodone as well as right shoulder steroid injection for shoulder osteoarthritis. No change in dose in her chronic pain medications.   Pharmacotherapy Assessment  Analgesic: Hydrocodone 7.5 mg 3 times daily PRN, quantity 90/month, MME equals 22.5   Monitoring: [place] PMP: PDMP reviewed during this encounter.       Pharmacotherapy: No side-effects or adverse reactions reported. Compliance: No problems identified. Effectiveness: Clinically acceptable.  UDS:  Summary  Date Value Ref Range Status  01/11/2022 Note  Final    Comment:    ==================================================================== [facility] (MW) ==================================================================== Test                             Result       Flag       Units  Drug Present and Declared for Prescription Verification   Hydrocodone                    367          EXPECTED   ng/mg creat   Norhydrocodone                 477          EXPECTED   ng/mg creat    Sources of hydrocodone include scheduled prescription medications.    Norhydrocodone is an expected metabolite of hydrocodone.  ==================================================================== Test                      Result  Flag   Units      Ref Range   Creatinine              30               mg/dL      >=20 ==================================================================== Declared Medications:  The flagging and interpretation on this report are based on the  following declared medications.  Unexpected results may arise from  inaccuracies in the declared medications.   **Note: The testing scope of this panel includes these medications:   Hydrocodone (Norco)   **Note:  The testing scope of this panel does not include the  following reported medications:   Acetaminophen (Norco)  Baclofen (Lioresal)  Calcium  Gabapentin (Neurontin)  Hydrochlorothiazide (Hydrodiuril)  Losartan (Cozaar)  Magnesium  Naproxen (Naprosyn)  Omeprazole (Prilosec)  Pravastatin (Pravachol)  Valsartan (Diovan)  Zinc ==================================================================== For clinical consultation, please call [phone]. ====================================================================       ROS  Constitutional: Denies any fever or chills Gastrointestinal: No reported hemesis, hematochezia, vomiting, or acute GI distress Musculoskeletal:  right shoulder pain Neurological: No reported episodes of acute onset apraxia, aphasia, dysarthria, agnosia, amnesia, paralysis, loss of coordination, or loss of consciousness  Medication Review  Calcium-Magnesium-Zinc, HYDROcodone-acetaminophen, baclofen, gabapentin, hydrochlorothiazide, losartan, naproxen, omeprazole, pravastatin, and valsartan-hydrochlorothiazide  History Review  Allergy: Ms. Schweppe is allergic to other, tape, zoster vaccine live, and seasonal ic [cholestatin]. Drug: Ms. Sepulveda  reports no history of drug use. Alcohol:  reports no history of alcohol use. Tobacco:  reports that she quit smoking about 15 years ago. Her smoking use included cigarettes. She has never used smokeless tobacco. Social: Ms. Dimichele  reports that she quit smoking about 15 years ago. Her smoking use included cigarettes. She has never used smokeless tobacco. She reports that she does not drink alcohol and does not use drugs. Medical:  has a past medical history of Allergy, Anxiety, Arthritis, Cancer ([place]) (09/2017), Hyperlipidemia, and Hypertension. Surgical: Ms. Schmuhl  has a past surgical history that includes Joint replacement; Abdominal hysterectomy; Wrist surgery; and Hernia repair (2021). Family: family  history includes Cancer in her father; Heart disease in her mother.   Physical Exam  General appearance: Well nourished, well developed, and well hydrated. In no apparent acute distress Mental status: Alert, oriented x 3 (person, place, & time)       Respiratory: No evidence of acute respiratory distress Eyes: PERLA Vitals: BP 127/61   Pulse 74   Temp (!) 97.2 F (36.2 C) (Temporal)   Resp 16   Ht '5\' 4"'$  (1.626 m)   Wt 220 lb (99.8 kg)   SpO2 100%   BMI 37.76 kg/m  BMI: Estimated body mass index is 37.76 kg/m as calculated from the following:   Height as of this encounter: '5\' 4"'$  (1.626 m).   Weight as of this encounter: 220 lb (99.8 kg). Ideal: Ideal body weight: 54.7 kg (120 lb 9.5 oz) Adjusted ideal body weight: 72.7 kg (160 lb 5.7 oz)   R shoulder pain, worse with abduction and overhead reach  Lumbar Spine Area Exam  Skin & Axial Inspection: No masses, redness, or swelling Alignment: Symmetrical Functional ROM: Pain restricted ROM       Stability: No instability detected Muscle Tone/Strength: Functionally intact. No obvious neuro-muscular anomalies detected. Sensory (Neurological): Musculoskeletal pain pattern Palpation: Complains of area being tender to palpation         Gait & Posture Assessment  Ambulation: Unassisted Gait: Relatively normal for age and body habitus  Posture: WNL  Lower Extremity Exam      Side: Right lower extremity   Side: Left lower extremity  Stability: No instability observed           Stability: No instability observed          Skin & Extremity Inspection: Skin color, temperature, and hair growth are WNL. No peripheral edema or cyanosis. No masses, redness, swelling, asymmetry, or associated skin lesions. No contractures.   Skin & Extremity Inspection: Skin color, temperature, and hair growth are WNL. No peripheral edema or cyanosis. No masses, redness, swelling, asymmetry, or associated skin lesions. No contractures.  Functional ROM: Pain  restricted ROM for knee joint           Functional ROM: Pain restricted ROM for knee joint          Muscle Tone/Strength: Functionally intact. No obvious neuro-muscular anomalies detected.   Muscle Tone/Strength: Functionally intact. No obvious neuro-muscular anomalies detected.  Sensory (Neurological): Arthropathic arthralgia         Sensory (Neurological): Arthropathic arthralgia        DTR: Patellar: deferred today Achilles: deferred today Plantar: deferred today   DTR: Patellar: deferred today Achilles: deferred today Plantar: deferred today  Palpation: No palpable anomalies   Palpation: No palpable anomalies     Assessment   Status Diagnosis  Worsening Persistent Controlled 1. Chronic right shoulder pain   2. Primary osteoarthritis of both shoulders   3. Lumbar degenerative disc disease   4. Chronic pain syndrome        Plan of Care   Ms. Ladona Rosten has a current medication list which includes the following long-term medication(s): hydrochlorothiazide, losartan, omeprazole, pravastatin, valsartan-hydrochlorothiazide, and gabapentin.  Pharmacotherapy (Medications Ordered): Meds ordered this encounter  Medications   lidocaine (XYLOCAINE) 2 % (with pres) injection 400 mg   iohexol (OMNIPAQUE) 180 MG/ML injection 10 mL    Must be Myelogram-compatible. If not available, you may substitute with a water-soluble, non-ionic, hypoallergenic, myelogram-compatible radiological contrast medium.   methylPREDNISolone acetate (DEPO-MEDROL) injection 40 mg   ropivacaine (PF) 2 mg/mL (0.2%) (NAROPIN) injection 4 mL   HYDROcodone-acetaminophen (NORCO) 7.5-325 MG tablet    Sig: Take 1 tablet by mouth every 8 (eight) hours as needed for severe pain. Must last 30 days.    Dispense:  90 tablet    Refill:  0    Chronic Pain: STOP Act (Not applicable) Fill 1 day early if closed on refill date. Avoid benzodiazepines within 8 hours of opioids   HYDROcodone-acetaminophen (NORCO) 7.5-325  MG tablet    Sig: Take 1 tablet by mouth every 8 (eight) hours as needed for severe pain. Must last 30 days.    Dispense:  90 tablet    Refill:  0    Chronic Pain: STOP Act (Not applicable) Fill 1 day early if closed on refill date. Avoid benzodiazepines within 8 hours of opioids   HYDROcodone-acetaminophen (NORCO) 7.5-325 MG tablet    Sig: Take 1 tablet by mouth every 8 (eight) hours as needed for severe pain. Must last 30 days.    Dispense:  90 tablet    Refill:  0    Chronic Pain: STOP Act (Not applicable) Fill 1 day early if closed on refill date. Avoid benzodiazepines within 8 hours of opioids   gabapentin (NEURONTIN) 300 MG capsule    Sig: Take 1 capsule (300 mg total) by mouth 3 (three) times daily.    Dispense:  90 capsule    Refill:  5   baclofen (LIORESAL) 10 MG tablet    Sig: Take 1 tablet (10 mg total) by mouth 2 (two) times daily as needed for muscle spasms.    Dispense:  60 tablet    Refill:  5    Do not place this medication, or any other prescription from our practice, on "Automatic Refill". Patient may have prescription filled one day early if pharmacy is closed on scheduled refill date.   See attached procedure note for right shoulder intra-articular steroid injection fluoroscopy.  Orders:  Orders Placed This Encounter  Procedures   DG PAIN CLINIC C-ARM 1-60 MIN NO REPORT    Intraoperative interpretation by procedural physician at [place].    Standing Status:   Standing    Number of Occurrences:   1    Order Specific Question:   Reason for exam:    Answer:   Assistance in needle guidance and placement for procedures requiring needle placement in or near specific anatomical locations not easily accessible without such assistance.    Follow-up plan:   Return in about 3 months (around 07/19/2022) for Medication Management, in person.   Recent Visits No visits were found meeting these conditions. Showing recent visits within past 90 days and meeting all  other requirements Today's Visits Date Type Provider Dept  04/19/22 Office Visit Gillis Santa, MD [facility]  Showing today's visits and meeting all other requirements Future Appointments Date Type Provider Dept  07/12/22 Appointment Gillis Santa, MD [facility]  Showing future appointments within next 90 days and meeting all other requirements  I discussed the assessment and treatment plan with the patient. The patient was provided an opportunity to ask questions and all were answered. The patient agreed with the plan and demonstrated an understanding of the instructions.  Patient advised to call back or seek an in-person evaluation if the symptoms or condition worsens.  Duration of encounter: 30 minutes.  Note by: Gillis Santa, MD Date: 04/19/2022; Time: 2:04 PM

## 2022-04-19 NOTE — Progress Notes (Signed)
PROVIDER NOTE: Interpretation of information contained herein should be left to medically-trained personnel. Specific patient instructions are provided elsewhere under "Patient Instructions" section of medical record. This document was created in part using STT-dictation technology, any transcriptional errors that may result from this process are unintentional.  Patient: Cindy Rose Type: Established DOB: March 04, 1946 MRN: [id number] PCP: Kathryne Sharper, MD  Service: Procedure DOS: 04/19/2022 Setting: Ambulatory Location: Ambulatory outpatient facility Delivery: Face-to-face Provider: Gillis Santa, MD Specialty: Interventional Pain Management Specialty designation: 09 Location: Outpatient facility Ref. Prov.: Kathryne Sharper, MD    Primary Reason for Visit: Interventional Pain Management Treatment. CC: Shoulder Pain (Right ), Knee Pain (Bilateral ), and Back Pain (Entire back )    Procedure:          Anesthesia, Analgesia, Anxiolysis:  Type: Diagnostic Glenohumeral Joint (shoulder) Injection #2  Primary Purpose: Diagnostic Region: Superior Shoulder Area Level:  Shoulder Target Area: Glenohumeral Joint (shoulder) Approach: Anterior approach. Laterality: Right  Anesthesia: Local (1-2% Lidocaine)  Anxiolysis: None  Sedation: None  Guidance: Fluoroscopy           Position: Supine   1. Chronic right shoulder pain   2. Primary osteoarthritis of both shoulders   3. Lumbar degenerative disc disease   4. Chronic pain syndrome    NAS-11 Pain score:   Pre-procedure: 6 /10   Post-procedure: 4 /10      Pre-op H&P Assessment:  Cindy Rose is a 77 y.o. (year old), female patient, seen today for interventional treatment. She  has a past surgical history that includes Joint replacement; Abdominal hysterectomy; Wrist surgery; and Hernia repair (2021). Cindy Rose has a current medication list which includes the following prescription(s): calcium-magnesium-zinc, hydrochlorothiazide,  [START ON 04/21/2022] hydrocodone-acetaminophen, [START ON 05/21/2022] hydrocodone-acetaminophen, [START ON 06/20/2022] hydrocodone-acetaminophen, losartan, naproxen, omeprazole, pravastatin, valsartan-hydrochlorothiazide, baclofen, and gabapentin. Her primarily concern today is the Shoulder Pain (Right ), Knee Pain (Bilateral ), and Back Pain (Entire back )  Initial Vital Signs:  Pulse/HCG Rate: 74ECG Heart Rate: (!) 59 Temp: (!) 97.2 F (36.2 C) Resp: 16 BP: (!) 146/74 SpO2: 100 %  BMI: Estimated body mass index is 37.76 kg/m as calculated from the following:   Height as of this encounter: '5\' 4"'$  (1.626 m).   Weight as of this encounter: 220 lb (99.8 kg).  Risk Assessment: Allergies: Reviewed. She is allergic to other, tape, zoster vaccine live, and seasonal ic [cholestatin].  Allergy Precautions: None required Coagulopathies: Reviewed. None identified.  Blood-thinner therapy: None at this time Active Infection(s): Reviewed. None identified. Cindy Rose is afebrile  Site Confirmation: Cindy Rose was asked to confirm the procedure and laterality before marking the site Procedure checklist: Completed Consent: Before the procedure and under the influence of no sedative(s), amnesic(s), or anxiolytics, the patient was informed of the treatment options, risks and possible complications. To fulfill our ethical and legal obligations, as recommended by the American Medical Association's Code of Ethics, I have informed the patient of my clinical impression; the nature and purpose of the treatment or procedure; the risks, benefits, and possible complications of the intervention; the alternatives, including doing nothing; the risk(s) and benefit(s) of the alternative treatment(s) or procedure(s); and the risk(s) and benefit(s) of doing nothing. The patient was provided information about the general risks and possible complications associated with the procedure. These may include, but are not limited to:  failure to achieve desired goals, infection, bleeding, organ or nerve damage, allergic reactions, paralysis, and death. In addition, the patient was informed of those risks and  complications associated to the procedure, such as failure to decrease pain; infection; bleeding; organ or nerve damage with subsequent damage to sensory, motor, and/or autonomic systems, resulting in permanent pain, numbness, and/or weakness of one or several areas of the body; allergic reactions; (i.e.: anaphylactic reaction); and/or death. Furthermore, the patient was informed of those risks and complications associated with the medications. These include, but are not limited to: allergic reactions (i.e.: anaphylactic or anaphylactoid reaction(s)); adrenal axis suppression; blood sugar elevation that in diabetics may result in ketoacidosis or comma; water retention that in patients with history of congestive heart failure may result in shortness of breath, pulmonary edema, and decompensation with resultant heart failure; weight gain; swelling or edema; medication-induced neural toxicity; particulate matter embolism and blood vessel occlusion with resultant organ, and/or nervous system infarction; and/or aseptic necrosis of one or more joints. Finally, the patient was informed that Medicine is not an exact science; therefore, there is also the possibility of unforeseen or unpredictable risks and/or possible complications that may result in a catastrophic outcome. The patient indicated having understood very clearly. We have given the patient no guarantees and we have made no promises. Enough time was given to the patient to ask questions, all of which were answered to the patient's satisfaction. Cindy Rose has indicated that she wanted to continue with the procedure. Attestation: I, the ordering provider, attest that I have discussed with the patient the benefits, risks, side-effects, alternatives, likelihood of achieving goals, and  potential problems during recovery for the procedure that I have provided informed consent. Date  Time: 04/19/2022 12:57 PM  Pre-Procedure Preparation:  Monitoring: As per clinic protocol. Respiration, ETCO2, SpO2, BP, heart rate and rhythm monitor placed and checked for adequate function Safety Precautions: Patient was assessed for positional comfort and pressure points before starting the procedure. Time-out: I initiated and conducted the "Time-out" before starting the procedure, as per protocol. The patient was asked to participate by confirming the accuracy of the "Time Out" information. Verification of the correct person, site, and procedure were performed and confirmed by me, the nursing staff, and the patient. "Time-out" conducted as per Joint Commission's Universal Protocol (UP.01.01.01). Time: 1338  Description of Procedure:          Area Prepped: Entire shoulder Area DuraPrep (Iodine Povacrylex [0.7% available iodine] and Isopropyl Alcohol, 74% w/w) Safety Precautions: Aspiration looking for blood return was conducted prior to all injections. At no point did we inject any substances, as a needle was being advanced. No attempts were made at seeking any paresthesias. Safe injection practices and needle disposal techniques used. Medications properly checked for expiration dates. SDV (single dose vial) medications used. Description of the Procedure: Protocol guidelines were followed. The patient was placed in position over the procedure table. The target area was identified and the area prepped in the usual manner. Skin & deeper tissues infiltrated with local anesthetic. Appropriate amount of time allowed to pass for local anesthetics to take effect. The procedure needles were then advanced to the target area. Proper needle placement secured. Negative aspiration confirmed. Solution injected in intermittent fashion, asking for systemic symptoms every 0.5cc of injectate. The needles were then removed  and the area cleansed, making sure to leave some of the prepping solution back to take advantage of its long term bactericidal properties.         Vitals:   04/19/22 1304 04/19/22 1335 04/19/22 1342  BP: (!) 146/74 130/75 127/61  Pulse: 74    Resp: 16 18 16  Temp: (!) 97.2 F (36.2 C)    TempSrc: Temporal    SpO2: 100% 100% 100%  Weight: 220 lb (99.8 kg)    Height: '5\' 4"'$  (1.626 m)      Start Time: 1338 hrs. End Time: 1341 hrs. Materials:  Needle(s) Type: Spinal Needle Gauge: 25G Length: 3.5-in Medication(s): Please see orders for medications and dosing details.   5cc solution made of 4 cc of 0.2% ropivacaine, 1 cc of methylprednisolone, 40 mg/cc.  Injected into the right glenohumeral joint after contrast confirmation   Imaging Guidance (Non-Spinal):          Type of Imaging Technique: Fluoroscopy Guidance (Non-Spinal) Indication(s): Assistance in needle guidance and placement for procedures requiring needle placement in or near specific anatomical locations not easily accessible without such assistance. Exposure Time: Please see nurses notes. Contrast: Before injecting any contrast, we confirmed that the patient did not have an allergy to iodine, shellfish, or radiological contrast. Once satisfactory needle placement was completed at the desired level, radiological contrast was injected. Contrast injected under live fluoroscopy. No contrast complications. See chart for type and volume of contrast used. Fluoroscopic Guidance: I was personally present during the use of fluoroscopy. "Tunnel Vision Technique" used to obtain the best possible view of the target area. Parallax error corrected before commencing the procedure. "Direction-depth-direction" technique used to introduce the needle under continuous pulsed fluoroscopy. Once target was reached, antero-posterior, oblique, and lateral fluoroscopic projection used confirm needle placement in all planes. Images permanently stored in  EMR. Interpretation: I personally interpreted the imaging intraoperatively. Adequate needle placement confirmed in multiple planes. Appropriate spread of contrast into desired area was observed. No evidence of afferent or efferent intravascular uptake. Permanent images saved into the patient's record.   Post-operative Assessment:  Post-procedure Vital Signs:  Pulse/HCG Rate: 7465 Temp: (!) 97.2 F (36.2 C) Resp: 16 BP:  127/61 SpO2: 100 %  EBL: None  Complications: No immediate post-treatment complications observed by team, or reported by patient.  Note: The patient tolerated the entire procedure well. A repeat set of vitals were taken after the procedure and the patient was kept under observation following institutional policy, for this type of procedure. Post-procedural neurological assessment was performed, showing return to baseline, prior to discharge. The patient was provided with post-procedure discharge instructions, including a section on how to identify potential problems. Should any problems arise concerning this procedure, the patient was given instructions to immediately contact us, at any time, without hesitation. In any case, we plan to contact the patient by telephone for a follow-up status report regarding this interventional procedure.  Comments:  No additional relevant information.  Plan of Care  Orders:  Orders Placed This Encounter  Procedures   DG PAIN [facility] C-ARM 1-60 MIN NO REPORT    Intraoperative interpretation by procedural physician at [facility].    Standing Status:   Standing    Number of Occurrences:   1    Order Specific Question:   Reason for exam:    Answer:   Assistance in needle guidance and placement for procedures requiring needle placement in or near specific anatomical locations not easily accessible without such assistance.   Chronic Opioid Analgesic:  Hydrocodone 7.5 mg 3 times daily PRN, quantity 90/month, MME equals 22.5    Medications ordered for procedure: Meds ordered this encounter  Medications   lidocaine (XYLOCAINE) 2 % (with pres) injection 400 mg   iohexol (OMNIPAQUE) 180 MG/ML injection 10 mL    Must be Myelogram-compatible.  If not available, you may substitute with a water-soluble, non-ionic, hypoallergenic, myelogram-compatible radiological contrast medium.   methylPREDNISolone acetate (DEPO-MEDROL) injection 40 mg   ropivacaine (PF) 2 mg/mL (0.2%) (NAROPIN) injection 4 mL   HYDROcodone-acetaminophen (NORCO) 7.5-325 MG tablet    Sig: Take 1 tablet by mouth every 8 (eight) hours as needed for severe pain. Must last 30 days.    Dispense:  90 tablet    Refill:  0    Chronic Pain: STOP Act (Not applicable) Fill 1 day early if closed on refill date. Avoid benzodiazepines within 8 hours of opioids   HYDROcodone-acetaminophen (NORCO) 7.5-325 MG tablet    Sig: Take 1 tablet by mouth every 8 (eight) hours as needed for severe pain. Must last 30 days.    Dispense:  90 tablet    Refill:  0    Chronic Pain: STOP Act (Not applicable) Fill 1 day early if closed on refill date. Avoid benzodiazepines within 8 hours of opioids   HYDROcodone-acetaminophen (NORCO) 7.5-325 MG tablet    Sig: Take 1 tablet by mouth every 8 (eight) hours as needed for severe pain. Must last 30 days.    Dispense:  90 tablet    Refill:  0    Chronic Pain: STOP Act (Not applicable) Fill 1 day early if closed on refill date. Avoid benzodiazepines within 8 hours of opioids   gabapentin (NEURONTIN) 300 MG capsule    Sig: Take 1 capsule (300 mg total) by mouth 3 (three) times daily.    Dispense:  90 capsule    Refill:  5   baclofen (LIORESAL) 10 MG tablet    Sig: Take 1 tablet (10 mg total) by mouth 2 (two) times daily as needed for muscle spasms.    Dispense:  60 tablet    Refill:  5    Do not place this medication, or any other prescription from our practice, on "Automatic Refill". Patient may have prescription filled one day early if  pharmacy is closed on scheduled refill date.   Medications administered: We administered lidocaine, iohexol, methylPREDNISolone acetate, and ropivacaine (PF) 2 mg/mL (0.2%).  See the medical record for exact dosing, route, and time of administration.  Follow-up plan:   Return in about 3 months (around 07/19/2022) for Medication Management, in person.      Recent Visits No visits were found meeting these conditions. Showing recent visits within past 90 days and meeting all other requirements Today's Visits Date Type Provider Dept  04/19/22 Office Visit Gillis Santa, MD [facility]  Showing today's visits and meeting all other requirements Future Appointments Date Type Provider Dept  07/12/22 Appointment Gillis Santa, MD [facility]  Showing future appointments within next 90 days and meeting all other requirements  Disposition: Discharge home  Discharge (Date  Time): 04/19/2022; 1350 hrs.   Primary Care Physician: Kathryne Sharper, MD Location: [facility] Note by: Gillis Santa, MD Date: 04/19/2022; Time: 2:03 PM  Disclaimer:  Medicine is not an exact science. The only guarantee in medicine is that nothing is guaranteed. It is important to note that the decision to proceed with this intervention was based on the information collected from the patient. The Data and conclusions were drawn from the patient's questionnaire, the interview, and the physical examination. Because the information was provided in large part by the patient, it cannot be guaranteed that it has not been purposely or unconsciously manipulated. Every effort has been made to obtain as much relevant data  as possible for this evaluation. It is important to note that the conclusions that lead to this procedure are derived in large part from the available data. Always take into account that the treatment will also be dependent on availability of resources and existing treatment  guidelines, considered by other Pain Management Practitioners as being common knowledge and practice, at the time of the intervention. For Medico-Legal purposes, it is also important to point out that variation in procedural techniques and pharmacological choices are the acceptable norm. The indications, contraindications, technique, and results of the above procedure should only be interpreted and judged by a Board-Certified Interventional Pain Specialist with extensive familiarity and expertise in the same exact procedure and technique.

## 2022-07-12 ENCOUNTER — Ambulatory Visit
Payer: Medicare HMO | Attending: Student in an Organized Health Care Education/Training Program | Admitting: Student in an Organized Health Care Education/Training Program

## 2022-07-12 ENCOUNTER — Encounter: Payer: Self-pay | Admitting: Student in an Organized Health Care Education/Training Program

## 2022-07-12 VITALS — BP 150/70 | HR 69 | Temp 97.5°F | Resp 16 | Ht 65.0 in | Wt 220.0 lb

## 2022-07-12 DIAGNOSIS — M7918 Myalgia, other site: Secondary | ICD-10-CM | POA: Diagnosis present

## 2022-07-12 DIAGNOSIS — M7061 Trochanteric bursitis, right hip: Secondary | ICD-10-CM | POA: Insufficient documentation

## 2022-07-12 DIAGNOSIS — G894 Chronic pain syndrome: Secondary | ICD-10-CM | POA: Insufficient documentation

## 2022-07-12 DIAGNOSIS — M25551 Pain in right hip: Secondary | ICD-10-CM | POA: Diagnosis present

## 2022-07-12 MED ORDER — HYDROCODONE-ACETAMINOPHEN 7.5-325 MG PO TABS: 1.0000 | ORAL_TABLET | Freq: Three times a day (TID) | ORAL | 0 refills | Status: AC | PRN

## 2022-07-12 MED ORDER — HYDROCODONE-ACETAMINOPHEN 7.5-325 MG PO TABS: 1.0000 | ORAL_TABLET | Freq: Three times a day (TID) | ORAL | 0 refills | Status: DC | PRN

## 2022-07-12 NOTE — Progress Notes (Signed)
Nursing Pain Medication Assessment:  Safety precautions to be maintained throughout the outpatient stay will include: orient to surroundings, keep bed in low position, maintain call bell within reach at all times, provide assistance with transfer out of bed and ambulation.  Medication Inspection Compliance: Pill count conducted under aseptic conditions, in front of the patient. Neither the pills nor the bottle was removed from the patient's sight at any time. Once count was completed pills were immediately returned to the patient in their original bottle.  Medication: Hydrocodone/APAP Pill/Patch Count:  29 of 90 pills remain Pill/Patch Appearance: Markings consistent with prescribed medication Bottle Appearance: Standard pharmacy container. Clearly labeled. Filled Date: 03 / 04 / 2024 Last Medication intake:  Today

## 2022-07-12 NOTE — Progress Notes (Signed)
PROVIDER NOTE: Information contained herein reflects review and annotations entered in association with encounter. Interpretation of such information and data should be left to medically-trained personnel. Information provided to patient can be located elsewhere in the medical record under "Patient Instructions". Document created using STT-dictation technology, any transcriptional errors that may result from process are unintentional.    Patient: Cindy Rose  Service Category: E/M  Provider: Gillis Santa, MD  DOB: 07-Sep-1945  DOS: 07/12/2022  Specialty: Interventional Pain Management  MRN: [id number]  Setting: Ambulatory outpatient  PCP: Kathryne Sharper, MD  Type: Established Patient    Referring Provider: Kathryne Sharper, MD  Location: Office  Delivery: Face-to-face     HPI  [name], a 77 y.o. year old female, is here today because of her Right hip pain [M25.551]. [name]'s primary complain today is Knee Pain (bilateral), Back Pain (lower), and Hip Pain (Right hip)  Last encounter: My last encounter with her was on 04/19/22 Pertinent problems: [name] has Primary osteoarthritis of both knees; Chronic pain syndrome; History of bilateral knee replacement; Anxiety state; Myofascial pain; Lumbar spondylosis; Bilateral hearing loss; GERD (gastroesophageal reflux disease); Spondylosis of lumbar region without myelopathy or radiculopathy; and Lumbar degenerative disc disease on their pertinent problem list. Pain Assessment: Severity of Chronic pain is reported as a 5 /10. Location: Back Lower/right leg to hip and down to mid thigh. Onset: More than a month ago. Quality: Burning, Pins and needles. Timing: Intermittent. Modifying factor(s): movement. Vitals:  height is 5\' 5"  (1.651 m) and weight is 220 lb (99.8 kg). Her temperature is 97.5 F (36.4 C) (abnormal). Her blood pressure is 150/70 (abnormal) and her pulse is 69. Her respiration is 16 and oxygen saturation is 98%.   Reason  for encounter: medication management.    -Increased pain in right hip with radiation to right anterior thigh, has difficulty sleeping on that side, not constant -Discussed getting a right hip xray but patient wants to hold off and see if it is still presents when she sees her PCP next week -right shoulder much better after shoulder joint injection, endorses improvement in ROM -Is holding off on NSAIDs per rec from GI doctor after colonoscopy.   Pharmacotherapy Assessment  Analgesic: Hydrocodone 7.5 mg 3 times daily PRN, quantity 90/month, MME equals 22.5   Monitoring:  PMP: PDMP reviewed during this encounter.       Pharmacotherapy: No side-effects or adverse reactions reported. Compliance: No problems identified. Effectiveness: Clinically acceptable.  UDS:  Summary  Date Value Ref Range Status  01/11/2022 Note  Final    Comment:    ==================================================================== [employer] Select 13 (MW) ==================================================================== Test                             Result       Flag       Units  Drug Present and Declared for Prescription Verification   Hydrocodone                    367          EXPECTED   ng/mg creat   Norhydrocodone                 477          EXPECTED   ng/mg creat    Sources of hydrocodone include scheduled prescription medications.    Norhydrocodone is an expected metabolite of hydrocodone.  ==================================================================== Test  Result    Flag   Units      Ref Range   Creatinine              30               mg/dL      >=20 ==================================================================== Declared Medications:  The flagging and interpretation on this report are based on the  following declared medications.  Unexpected results may arise from  inaccuracies in the declared medications.   **Note: The testing scope of this panel includes  these medications:   Hydrocodone (Norco)   **Note: The testing scope of this panel does not include the  following reported medications:   Acetaminophen (Norco)  Baclofen (Lioresal)  Calcium  Gabapentin (Neurontin)  Hydrochlorothiazide (Hydrodiuril)  Losartan (Cozaar)  Magnesium  Naproxen (Naprosyn)  Omeprazole (Prilosec)  Pravastatin (Pravachol)  Valsartan (Diovan)  Zinc ==================================================================== For clinical consultation, please call [phone]. ====================================================================       ROS  Constitutional: Denies any fever or chills Gastrointestinal: No reported hemesis, hematochezia, vomiting, or acute GI distress Musculoskeletal:  right hip pain Neurological: No reported episodes of acute onset apraxia, aphasia, dysarthria, agnosia, amnesia, paralysis, loss of coordination, or loss of consciousness  Medication Review  Calcium-Magnesium-Zinc, HYDROcodone-acetaminophen, baclofen, gabapentin, hydrochlorothiazide, naproxen, omeprazole, pravastatin, and valsartan-hydrochlorothiazide  History Review  Allergy: [name] is allergic to ace inhibitors, other, tape, zoster vaccine live, and seasonal ic [cholestatin]. Drug: [name]  reports no history of drug use. Alcohol:  reports no history of alcohol use. Tobacco:  reports that she quit smoking about 15 years ago. Her smoking use included cigarettes. She has never used smokeless tobacco. Social: [name]  reports that she quit smoking about 15 years ago. Her smoking use included cigarettes. She has never used smokeless tobacco. She reports that she does not drink alcohol and does not use drugs. Medical:  has a past medical history of Allergy, Anxiety, Arthritis, Cancer (Cindy Rose) (09/2017), Hyperlipidemia, and Hypertension. Surgical: [name]  has a past surgical history that includes Joint replacement; Abdominal hysterectomy; Wrist  surgery; and Hernia repair (2021). Family: family history includes Cancer in her father; Heart disease in her mother.   Physical Exam  General appearance: Well nourished, well developed, and well hydrated. In no apparent acute distress Mental status: Alert, oriented x 3 (person, place, & time)       Respiratory: No evidence of acute respiratory distress Eyes: PERLA Vitals: BP (!) 150/70   Pulse 69   Temp (!) 97.5 F (36.4 C)   Resp 16   Ht 5\' 5"  (1.651 m)   Wt 220 lb (99.8 kg)   SpO2 98%   BMI 36.61 kg/m  BMI: Estimated body mass index is 36.61 kg/m as calculated from the following:   Height as of this encounter: 5\' 5"  (1.651 m).   Weight as of this encounter: 220 lb (99.8 kg). Ideal: Ideal body weight: 57 kg (125 lb 10.6 oz) Adjusted ideal body weight: 74.1 kg (163 lb 6.4 oz)   Lumbar Spine Area Exam  Skin & Axial Inspection: No masses, redness, or swelling Alignment: Symmetrical Functional ROM: Pain restricted ROM       Stability: No instability detected Muscle Tone/Strength: Functionally intact. No obvious neuro-muscular anomalies detected. Sensory (Neurological): Musculoskeletal pain pattern Palpation: Complains of area being tender to palpation overlying right hip bursa       Gait & Posture Assessment  Ambulation: Unassisted Gait: Relatively normal for age and body habitus Posture: WNL  Lower Extremity Exam      Side: Right lower extremity   Side: Left lower extremity  Stability: No instability observed           Stability: No instability observed          Skin & Extremity Inspection: Skin color, temperature, and hair growth are WNL. No peripheral edema or cyanosis. No masses, redness, swelling, asymmetry, or associated skin lesions. No contractures.   Skin & Extremity Inspection: Skin color, temperature, and hair growth are WNL. No peripheral edema or cyanosis. No masses, redness, swelling, asymmetry, or associated skin lesions. No contractures.  Functional ROM: Pain  restricted ROM for hip joint           Functional ROM: Pain restricted ROM for knee joint          Muscle Tone/Strength: Functionally intact. No obvious neuro-muscular anomalies detected.   Muscle Tone/Strength: Functionally intact. No obvious neuro-muscular anomalies detected.  Sensory (Neurological): Arthropathic arthralgia         Sensory (Neurological): Arthropathic arthralgia        DTR: Patellar: deferred today Achilles: deferred today Plantar: deferred today   DTR: Patellar: deferred today Achilles: deferred today Plantar: deferred today  Palpation: No palpable anomalies   Palpation: No palpable anomalies     Assessment   Status Diagnosis  Worsening Persistent Controlled 1. Right hip pain   2. Trochanteric bursitis of right hip   3. Myofascial pain   4. Chronic pain syndrome         Plan of Care   Ms. Ashanta Penny has a current medication list which includes the following long-term medication(s): gabapentin, hydrochlorothiazide, omeprazole, pravastatin, and valsartan-hydrochlorothiazide.  Pharmacotherapy (Medications Ordered): Meds ordered this encounter  Medications   HYDROcodone-acetaminophen (NORCO) 7.5-325 MG tablet    Sig: Take 1 tablet by mouth every 8 (eight) hours as needed for severe pain. Must last 30 days.    Dispense:  90 tablet    Refill:  0    Chronic Pain: STOP Act (Not applicable) Fill 1 day early if closed on refill date. Avoid benzodiazepines within 8 hours of opioids   HYDROcodone-acetaminophen (NORCO) 7.5-325 MG tablet    Sig: Take 1 tablet by mouth every 8 (eight) hours as needed for severe pain. Must last 30 days.    Dispense:  90 tablet    Refill:  0    Chronic Pain: STOP Act (Not applicable) Fill 1 day early if closed on refill date. Avoid benzodiazepines within 8 hours of opioids   HYDROcodone-acetaminophen (NORCO) 7.5-325 MG tablet    Sig: Take 1 tablet by mouth every 8 (eight) hours as needed for severe pain. Must last 30 days.     Dispense:  90 tablet    Refill:  0    Chronic Pain: STOP Act (Not applicable) Fill 1 day early if closed on refill date. Avoid benzodiazepines within 8 hours of opioids   Continue Baclofen and Gabapentin as prescribed, no refills needed Holding off on NSAIDs UDS UTD Patent will let me know if hip/bursa pain gets worse if she wants to try hip bursa injection  Orders:  No orders of the defined types were placed in this encounter.   Follow-up plan:   Return in about 3 months (around 10/13/2022) for Medication Management, in person.   Recent Visits Date Type Provider Dept  04/19/22 Office Visit Gillis Santa, MD [facility]  Showing recent visits within past 90 days and meeting all other requirements Today's Visits Date  Type Provider Dept  07/12/22 Office Visit Gillis Santa, MD [facility]  Showing today's visits and meeting all other requirements Future Appointments No visits were found meeting these conditions. Showing future appointments within next 90 days and meeting all other requirements  I discussed the assessment and treatment plan with the patient. The patient was provided an opportunity to ask questions and all were answered. The patient agreed with the plan and demonstrated an understanding of the instructions.  Patient advised to call back or seek an in-person evaluation if the symptoms or condition worsens.  Duration of encounter: 30 minutes.  Note by: Gillis Santa, MD Date: 07/12/2022; Time: 2:31 PM

## 2022-10-11 ENCOUNTER — Encounter: Payer: Self-pay | Admitting: Student in an Organized Health Care Education/Training Program

## 2022-10-11 ENCOUNTER — Ambulatory Visit
Payer: Medicare HMO | Attending: Student in an Organized Health Care Education/Training Program | Admitting: Student in an Organized Health Care Education/Training Program

## 2022-10-11 VITALS — BP 128/73 | HR 76 | Temp 97.2°F | Ht 65.0 in | Wt 220.0 lb

## 2022-10-11 DIAGNOSIS — G8929 Other chronic pain: Secondary | ICD-10-CM | POA: Diagnosis present

## 2022-10-11 DIAGNOSIS — M19012 Primary osteoarthritis, left shoulder: Secondary | ICD-10-CM | POA: Diagnosis present

## 2022-10-11 DIAGNOSIS — G894 Chronic pain syndrome: Secondary | ICD-10-CM | POA: Diagnosis present

## 2022-10-11 DIAGNOSIS — M5136 Other intervertebral disc degeneration, lumbar region: Secondary | ICD-10-CM | POA: Insufficient documentation

## 2022-10-11 DIAGNOSIS — M7918 Myalgia, other site: Secondary | ICD-10-CM | POA: Insufficient documentation

## 2022-10-11 DIAGNOSIS — M25511 Pain in right shoulder: Secondary | ICD-10-CM | POA: Insufficient documentation

## 2022-10-11 DIAGNOSIS — M7061 Trochanteric bursitis, right hip: Secondary | ICD-10-CM | POA: Insufficient documentation

## 2022-10-11 DIAGNOSIS — M19011 Primary osteoarthritis, right shoulder: Secondary | ICD-10-CM | POA: Diagnosis present

## 2022-10-11 MED ORDER — GABAPENTIN 300 MG PO CAPS: 300.0000 mg | ORAL_CAPSULE | Freq: Three times a day (TID) | ORAL | 5 refills | Status: DC

## 2022-10-11 MED ORDER — HYDROCODONE-ACETAMINOPHEN 7.5-325 MG PO TABS: 1.0000 | ORAL_TABLET | Freq: Three times a day (TID) | ORAL | 0 refills | Status: AC | PRN

## 2022-10-11 MED ORDER — BACLOFEN 10 MG PO TABS
10.0000 mg | ORAL_TABLET | Freq: Two times a day (BID) | ORAL | 5 refills | Status: DC | PRN
Start: 2022-10-11 — End: 2023-04-04

## 2022-10-11 NOTE — Progress Notes (Signed)
Nursing Pain Medication Assessment:  Safety precautions to be maintained throughout the outpatient stay will include: orient to surroundings, keep bed in low position, maintain call bell within reach at all times, provide assistance with transfer out of bed and ambulation.  Medication Inspection Compliance: Pill count conducted under aseptic conditions, in front of the patient. Neither the pills nor the bottle was removed from the patient's sight at any time. Once count was completed pills were immediately returned to the patient in their original bottle.  Medication: Hydrocodone/APAP Pill/Patch Count:  30 of 90 pills remain Pill/Patch Appearance: Markings consistent with prescribed medication Bottle Appearance: Standard pharmacy container. Clearly labeled. Filled Date: 6 / 1 / 2024 Last Medication intake:  TodaySafety precautions to be maintained throughout the outpatient stay will include: orient to surroundings, keep bed in low position, maintain call bell within reach at all times, provide assistance with transfer out of bed and ambulation.

## 2022-10-11 NOTE — Progress Notes (Signed)
PROVIDER NOTE: Information contained herein reflects review and annotations entered in association with encounter. Interpretation of such information and data should be left to medically-trained personnel. Information provided to patient can be located elsewhere in the medical record under "Patient Instructions". Document created using STT-dictation technology, any transcriptional errors that may result from process are unintentional.    Patient: Cindy Rose  Service Category: E/M  Provider: Edward Jolly, MD  DOB: 1945-08-08  DOS: 10/11/2022  Specialty: Interventional Pain Management  MRN: [id number]  Setting: Ambulatory outpatient  PCP: Derald Macleod, MD  Type: Established Patient    Referring Provider: Derald Macleod, MD  Location: Office  Delivery: Face-to-face     HPI  Cindy Rose, a 77 y.o. year old female, is here today because of her Trochanteric bursitis of right hip [M70.61]. Ms. Muriel primary complain today is Knee Pain (bilateral)  Last encounter: My last encounter with her was on 07/12/2022 Pertinent problems: Cindy Rose has Primary osteoarthritis of both knees; Chronic pain syndrome; History of bilateral knee replacement; Anxiety state; Myofascial pain; Lumbar spondylosis; Bilateral hearing loss; GERD (gastroesophageal reflux disease); Spondylosis of lumbar region without myelopathy or radiculopathy; and Lumbar degenerative disc disease on their pertinent problem list. Pain Assessment: Severity of Chronic pain is reported as a 6 /10. Location: Knee Right, Left/radiates down legs when walking. Onset: More than a month ago. Quality: Sharp, Stabbing, Pressure. Timing: Intermittent. Modifying factor(s): baclofen. Vitals:  height is 5\' 5"  (1.651 m) and weight is 220 lb (99.8 kg). Her temporal temperature is 97.2 F (36.2 C) (abnormal). Her blood pressure is 128/73 and her pulse is 76. Her oxygen saturation is 96%.   Reason for encounter: medication management.    No  change in medical history since last visit.  Patient's pain is at baseline.  Patient continues multimodal pain regimen as prescribed.  States that it provides pain relief and improvement in functional status.    Pharmacotherapy Assessment  Analgesic: Hydrocodone 7.5 mg 3 times daily PRN, quantity 90/month, MME equals 22.5   Monitoring: [place] PMP: PDMP reviewed during this encounter.       Pharmacotherapy: No side-effects or adverse reactions reported. Compliance: No problems identified. Effectiveness: Clinically acceptable.  UDS:  Summary  Date Value Ref Range Status  01/11/2022 Note  Final    Comment:    ==================================================================== [employer] Select 13 (MW) ==================================================================== Test                             Result       Flag       Units  Drug Present and Declared for Prescription Verification   Hydrocodone                    367          EXPECTED   ng/mg creat   Norhydrocodone                 477          EXPECTED   ng/mg creat    Sources of hydrocodone include scheduled prescription medications.    Norhydrocodone is an expected metabolite of hydrocodone.  ==================================================================== Test                      Result    Flag   Units      Ref Range   Creatinine  30               mg/dL      >=10 ==================================================================== Declared Medications:  The flagging and interpretation on this report are based on the  following declared medications.  Unexpected results may arise from  inaccuracies in the declared medications.   **Note: The testing scope of this panel includes these medications:   Hydrocodone (Norco)   **Note: The testing scope of this panel does not include the  following reported medications:   Acetaminophen (Norco)  Baclofen (Lioresal)  Calcium  Gabapentin (Neurontin)   Hydrochlorothiazide (Hydrodiuril)  Losartan (Cozaar)  Magnesium  Naproxen (Naprosyn)  Omeprazole (Prilosec)  Pravastatin (Pravachol)  Valsartan (Diovan)  Zinc ==================================================================== For clinical consultation, please call [phone]. ====================================================================       ROS  Constitutional: Denies any fever or chills Gastrointestinal: No reported hemesis, hematochezia, vomiting, or acute GI distress Musculoskeletal:  right hip pain Neurological: No reported episodes of acute onset apraxia, aphasia, dysarthria, agnosia, amnesia, paralysis, loss of coordination, or loss of consciousness  Medication Review  Calcium-Magnesium-Zinc, HYDROcodone-acetaminophen, baclofen, gabapentin, hydrochlorothiazide, losartan, naproxen, omeprazole, pravastatin, rosuvastatin, and valsartan-hydrochlorothiazide  History Review  Allergy: Cindy Rose is allergic to ace inhibitors, other, tape, zoster vaccine live, and seasonal ic [cholestatin]. Drug: Cindy Rose  reports no history of drug use. Alcohol:  reports no history of alcohol use. Tobacco:  reports that she quit smoking about 15 years ago. Her smoking use included cigarettes. She has never used smokeless tobacco. Social: Cindy Rose  reports that she quit smoking about 15 years ago. Her smoking use included cigarettes. She has never used smokeless tobacco. She reports that she does not drink alcohol and does not use drugs. Medical:  has a past medical history of Allergy, Anxiety, Arthritis, Cancer ([facility]) (09/2017), Hyperlipidemia, and Hypertension. Surgical: Cindy Rose  has a past surgical history that includes Joint replacement; Abdominal hysterectomy; Wrist surgery; and Hernia repair (2021). Family: family history includes Cancer in her father; Heart disease in her mother.   Physical Exam  General appearance: Well nourished, well developed, and well  hydrated. In no apparent acute distress Mental status: Alert, oriented x 3 (person, place, & time)       Respiratory: No evidence of acute respiratory distress Eyes: PERLA Vitals: BP 128/73   Pulse 76   Temp (!) 97.2 F (36.2 C) (Temporal)   Ht 5\' 5"  (1.651 m)   Wt 220 lb (99.8 kg)   SpO2 96%   BMI 36.61 kg/m  BMI: Estimated body mass index is 36.61 kg/m as calculated from the following:   Height as of this encounter: 5\' 5"  (1.651 m).   Weight as of this encounter: 220 lb (99.8 kg). Ideal: Ideal body weight: 57 kg (125 lb 10.6 oz) Adjusted ideal body weight: 74.1 kg (163 lb 6.4 oz)   Lumbar Spine Area Exam  Skin & Axial Inspection: No masses, redness, or swelling Alignment: Symmetrical Functional ROM: Pain restricted ROM       Stability: No instability detected Muscle Tone/Strength: Functionally intact. No obvious neuro-muscular anomalies detected. Sensory (Neurological): Musculoskeletal pain pattern Palpation: Complains of area being tender to palpation overlying right hip bursa       Gait & Posture Assessment  Ambulation: Unassisted Gait: Relatively normal for age and body habitus Posture: WNL  Lower Extremity Exam      Side: Right lower extremity   Side: Left lower extremity  Stability: No instability observed  Stability: No instability observed          Skin & Extremity Inspection: Skin color, temperature, and hair growth are WNL. No peripheral edema or cyanosis. No masses, redness, swelling, asymmetry, or associated skin lesions. No contractures.   Skin & Extremity Inspection: Skin color, temperature, and hair growth are WNL. No peripheral edema or cyanosis. No masses, redness, swelling, asymmetry, or associated skin lesions. No contractures.  Functional ROM: Pain restricted ROM for hip joint           Functional ROM: Pain restricted ROM for knee joint          Muscle Tone/Strength: Functionally intact. No obvious neuro-muscular anomalies detected.   Muscle  Tone/Strength: Functionally intact. No obvious neuro-muscular anomalies detected.  Sensory (Neurological): Arthropathic arthralgia         Sensory (Neurological): Arthropathic arthralgia        DTR: Patellar: deferred today Achilles: deferred today Plantar: deferred today   DTR: Patellar: deferred today Achilles: deferred today Plantar: deferred today  Palpation: No palpable anomalies   Palpation: No palpable anomalies     Assessment   Status Diagnosis  Stable Stable Controlled 1. Trochanteric bursitis of right hip   2. Myofascial pain   3. Chronic right shoulder pain   4. Primary osteoarthritis of both shoulders   5. Lumbar degenerative disc disease   6. Chronic pain syndrome         Plan of Care   Ms. Jacques Willingham has a current medication list which includes the following long-term medication(s): hydrochlorothiazide, omeprazole, rosuvastatin, valsartan-hydrochlorothiazide, gabapentin, and pravastatin.  Pharmacotherapy (Medications Ordered): Meds ordered this encounter  Medications   HYDROcodone-acetaminophen (NORCO) 7.5-325 MG tablet    Sig: Take 1 tablet by mouth every 8 (eight) hours as needed for severe pain. Must last 30 days.    Dispense:  90 tablet    Refill:  0    Chronic Pain: STOP Act (Not applicable) Fill 1 day early if closed on refill date. Avoid benzodiazepines within 8 hours of opioids   HYDROcodone-acetaminophen (NORCO) 7.5-325 MG tablet    Sig: Take 1 tablet by mouth every 8 (eight) hours as needed for severe pain. Must last 30 days.    Dispense:  90 tablet    Refill:  0    Chronic Pain: STOP Act (Not applicable) Fill 1 day early if closed on refill date. Avoid benzodiazepines within 8 hours of opioids   HYDROcodone-acetaminophen (NORCO) 7.5-325 MG tablet    Sig: Take 1 tablet by mouth every 8 (eight) hours as needed for severe pain. Must last 30 days.    Dispense:  90 tablet    Refill:  0    Chronic Pain: STOP Act (Not applicable) Fill 1 day  early if closed on refill date. Avoid benzodiazepines within 8 hours of opioids   gabapentin (NEURONTIN) 300 MG capsule    Sig: Take 1 capsule (300 mg total) by mouth 3 (three) times daily.    Dispense:  90 capsule    Refill:  5   baclofen (LIORESAL) 10 MG tablet    Sig: Take 1 tablet (10 mg total) by mouth 2 (two) times daily as needed for muscle spasms.    Dispense:  60 tablet    Refill:  5    Do not place this medication, or any other prescription from our practice, on "Automatic Refill". Patient may have prescription filled one day early if pharmacy is closed on scheduled refill date.   Holding off on NSAIDs  UDS UTD Patent will let me know if hip/bursa pain gets worse if she wants to try hip bursa injection  Orders:  No orders of the defined types were placed in this encounter.   Follow-up plan:   Return in about 3 months (around 01/11/2023) for Medication Management, in person.   Recent Visits No visits were found meeting these conditions. Showing recent visits within past 90 days and meeting all other requirements Today's Visits Date Type Provider Dept  10/11/22 Office Visit Edward Jolly, MD [facility]  Showing today's visits and meeting all other requirements Future Appointments No visits were found meeting these conditions. Showing future appointments within next 90 days and meeting all other requirements  I discussed the assessment and treatment plan with the patient. The patient was provided an opportunity to ask questions and all were answered. The patient agreed with the plan and demonstrated an understanding of the instructions.  Patient advised to call back or seek an in-person evaluation if the symptoms or condition worsens.  Duration of encounter: 30 minutes.  Note by: Edward Jolly, MD Date: 10/11/2022; Time: 2:48 PM

## 2023-01-10 ENCOUNTER — Ambulatory Visit
Payer: Medicare HMO | Attending: Student in an Organized Health Care Education/Training Program | Admitting: Student in an Organized Health Care Education/Training Program

## 2023-01-10 ENCOUNTER — Encounter: Payer: Self-pay | Admitting: Student in an Organized Health Care Education/Training Program

## 2023-01-10 VITALS — BP 140/57 | HR 63 | Temp 97.3°F | Ht 65.0 in | Wt 220.0 lb

## 2023-01-10 DIAGNOSIS — G894 Chronic pain syndrome: Secondary | ICD-10-CM | POA: Insufficient documentation

## 2023-01-10 DIAGNOSIS — M19012 Primary osteoarthritis, left shoulder: Secondary | ICD-10-CM | POA: Insufficient documentation

## 2023-01-10 DIAGNOSIS — M7061 Trochanteric bursitis, right hip: Secondary | ICD-10-CM | POA: Insufficient documentation

## 2023-01-10 DIAGNOSIS — M25511 Pain in right shoulder: Secondary | ICD-10-CM | POA: Diagnosis not present

## 2023-01-10 DIAGNOSIS — M7918 Myalgia, other site: Secondary | ICD-10-CM | POA: Diagnosis present

## 2023-01-10 DIAGNOSIS — G8929 Other chronic pain: Secondary | ICD-10-CM | POA: Diagnosis present

## 2023-01-10 DIAGNOSIS — M19011 Primary osteoarthritis, right shoulder: Secondary | ICD-10-CM | POA: Diagnosis not present

## 2023-01-10 DIAGNOSIS — M5136 Other intervertebral disc degeneration, lumbar region: Secondary | ICD-10-CM | POA: Insufficient documentation

## 2023-01-10 NOTE — Patient Instructions (Signed)
Return in 3 months for med refill.

## 2023-01-10 NOTE — Progress Notes (Signed)
Nursing Pain Medication Assessment:  Safety precautions to be maintained throughout the outpatient stay will include: orient to surroundings, keep bed in low position, maintain call bell within reach at all times, provide assistance with transfer out of bed and ambulation.  Medication Inspection Compliance: Ms. Sangiacomo did not comply with our request to bring her pills to be counted. She was reminded that bringing the medication bottles, even when empty, is a requirement.  Medication: None brought in. Pill/Patch Count: None available to be counted. Bottle Appearance: No container available. Did not bring bottle(s) to appointment. Filled Date: N/A Last Medication intake:  Ran out of medicine more than 48 hours agoSafety precautions to be maintained throughout the outpatient stay will include: orient to surroundings, keep bed in low position, maintain call bell within reach at all times, provide assistance with transfer out of bed and ambulation.

## 2023-01-10 NOTE — Progress Notes (Signed)
PROVIDER NOTE: Information contained herein reflects review and annotations entered in association with encounter. Interpretation of such information and data should be left to medically-trained personnel. Information provided to patient can be located elsewhere in the medical record under "Patient Instructions". Document created using STT-dictation technology, any transcriptional errors that may result from process are unintentional.    Patient: Cindy Rose  Service Category: E/M  Provider: Edward Jolly, MD  DOB: [birth year]  DOS: 01/10/2023  Referring Provider: Derald Macleod, MD  MRN: [id number]  Specialty: Interventional Pain Management  PCP: Derald Macleod, MD  Type: Established Patient  Setting: Ambulatory outpatient    Location: Office  Delivery: Face-to-face     HPI  Cindy Rose, a 77 y.o. year old female, is here today because of her Trochanteric bursitis of right hip [M70.61]. Cindy Rose's primary complain today is Leg Pain (both)  Pertinent problems: Cindy Rose has Primary osteoarthritis of both knees; Chronic pain syndrome; History of bilateral knee replacement; Anxiety state; Myofascial pain; Lumbar spondylosis; Bilateral hearing loss; GERD (gastroesophageal reflux disease); Spondylosis of lumbar region without myelopathy or radiculopathy; and Lumbar degenerative disc disease on their pertinent problem list. Pain Assessment: Severity of Chronic pain is reported as a 7 /10. Location: Leg Left, Right/pain radiaties down her leg. Onset: More than a month ago. Quality: Aching, Burning, Constant. Timing: Constant. Modifying factor(s): Meds. Vitals:  height is 5\' 5"  (1.651 m) and weight is 220 lb (99.8 kg). Her temperature is 97.3 F (36.3 C) (abnormal). Her blood pressure is 140/57 (abnormal) and her pulse is 63. Her oxygen saturation is 100%.  BMI: Estimated body mass index is 36.61 kg/m as calculated from the following:   Height as of this encounter: 5\' 5"  (1.651 m).    Weight as of this encounter: 220 lb (99.8 kg). Last encounter: 10/11/2022. Last procedure: Visit date not found.  Reason for encounter: medication management.  Patient states that she wean herself off of hydrocodone.  She tried CBD and states that it was not helpful for her chronic pain.  She states that she would like to return back to her hydrocodone as previously prescribed.  She continues to struggle with shoulder pain, right greater than left as well as low back pain that radiates down her right greater than left leg.  She states that her right shoulder pain is worse after she works.  She tries to do home stretching exercises to alleviate the stiffness.  She wants to hold off on any additional injections in her shoulder.  We were able to call the pharmacy and verify that she still has 3 prescriptions of hydrocodone that were written in June.  I instructed her to continue with those 3 prescriptions.  Follow-up in 3 months for medication management.  If she would like to consider a suprascapular nerve block or suprascapular nerve stimulation, I informed her to notify me and I can provide her with additional resources.  Pharmacotherapy Assessment  Analgesic: Hydrocodone 7.5 mg 3 times daily PRN, quantity 90/month, MME equals 22.5   Monitoring: [place] PMP: PDMP not reviewed this encounter.       Pharmacotherapy: No side-effects or adverse reactions reported. Compliance: No problems identified. Effectiveness: Clinically acceptable.  Brigitte Pulse, RN  01/10/2023  1:49 PM  Sign when Signing Visit Nursing Pain Medication Assessment:  Safety precautions to be maintained throughout the outpatient stay will include: orient to surroundings, keep bed in low position, maintain call bell within reach at all times, provide assistance with transfer out of  bed and ambulation.  Medication Inspection Compliance: Cindy Rose did not comply with our request to bring her pills to be counted. She was reminded that  bringing the medication bottles, even when empty, is a requirement.  Medication: None brought in. Pill/Patch Count: None available to be counted. Bottle Appearance: No container available. Did not bring bottle(s) to appointment. Filled Date: N/A Last Medication intake:  Ran out of medicine more than 48 hours agoSafety precautions to be maintained throughout the outpatient stay will include: orient to surroundings, keep bed in low position, maintain call bell within reach at all times, provide assistance with transfer out of bed and ambulation.   No results found for: "CBDTHCR" No results found for: "D8THCCBX" No results found for: "D9THCCBX"  UDS:  Summary  Date Value Ref Range Status  01/11/2022 Note  Final    Comment:    ==================================================================== [facility] (MW) ==================================================================== Test                             Result       Flag       Units  Drug Present and Declared for Prescription Verification   Hydrocodone                    367          EXPECTED   ng/mg creat   Norhydrocodone                 477          EXPECTED   ng/mg creat    Sources of hydrocodone include scheduled prescription medications.    Norhydrocodone is an expected metabolite of hydrocodone.  ==================================================================== Test                      Result    Flag   Units      Ref Range   Creatinine              30               mg/dL      >=40 ==================================================================== Declared Medications:  The flagging and interpretation on this report are based on the  following declared medications.  Unexpected results may arise from  inaccuracies in the declared medications.   **Note: The testing scope of this panel includes these medications:   Hydrocodone (Norco)   **Note: The testing scope of this panel does not include the  following  reported medications:   Acetaminophen (Norco)  Baclofen (Lioresal)  Calcium  Gabapentin (Neurontin)  Hydrochlorothiazide (Hydrodiuril)  Losartan (Cozaar)  Magnesium  Naproxen (Naprosyn)  Omeprazole (Prilosec)  Pravastatin (Pravachol)  Valsartan (Diovan)  Zinc ==================================================================== For clinical consultation, please call [phone]. ====================================================================       ROS  Constitutional: Denies any fever or chills Gastrointestinal: No reported hemesis, hematochezia, vomiting, or acute GI distress Musculoskeletal:  Low back pain with radiation into right hip Neurological: No reported episodes of acute onset apraxia, aphasia, dysarthria, agnosia, amnesia, paralysis, loss of coordination, or loss of consciousness  Medication Review  Calcium-Magnesium-Zinc, HYDROcodone-acetaminophen, baclofen, gabapentin, hydrochlorothiazide, losartan, naproxen, omeprazole, pravastatin, rosuvastatin, and valsartan-hydrochlorothiazide  History Review  Allergy: Cindy Rose is allergic to ace inhibitors, other, tape, zoster vaccine live, and seasonal ic [cholestatin]. Drug: Cindy Rose  reports no history of drug use. Alcohol:  reports no history of alcohol use. Tobacco:  reports that she quit smoking  about 16 years ago. Her smoking use included cigarettes. She has never used smokeless tobacco. Social: Cindy Rose  reports that she quit smoking about 16 years ago. Her smoking use included cigarettes. She has never used smokeless tobacco. She reports that she does not drink alcohol and does not use drugs. Medical:  has a past medical history of Allergy, Anxiety, Arthritis, Cancer (HCC) (09/2017), Hyperlipidemia, and Hypertension. Surgical: Cindy Rose  has a past surgical history that includes Joint replacement; Abdominal hysterectomy; Wrist surgery; and Hernia repair (2021). Family: family history includes  Cancer in her father; Heart disease in her mother.  Laboratory Chemistry Profile   Renal No results found for: "BUN", "CREATININE", "LABCREA", "BCR", "GFR", "GFRAA", "GFRNONAA", "LABVMA", "EPIRU", "EPINEPH24HUR", "NOREPRU", "NOREPI24HUR", "DOPARU", "DOPAM24HRUR"  Hepatic No results found for: "AST", "ALT", "ALBUMIN", "ALKPHOS", "HCVAB", "AMYLASE", "LIPASE", "AMMONIA"  Electrolytes No results found for: "NA", "K", "CL", "CALCIUM", "MG", "PHOS"  Bone No results found for: "VD25OH", "VD125OH2TOT", "NF6213YQ6", "VH8469GE9", "25OHVITD1", "25OHVITD2", "25OHVITD3", "TESTOFREE", "TESTOSTERONE"  Inflammation (CRP: Acute Phase) (ESR: Chronic Phase) No results found for: "CRP", "ESRSEDRATE", "LATICACIDVEN"       Note: Above Lab results reviewed.  Recent Imaging Review  DG PAIN CLINIC C-ARM 1-60 MIN NO REPORT Fluoro was used, but no Radiologist interpretation will be provided.  Please refer to "NOTES" tab for provider progress note. Note: Reviewed        Physical Exam  General appearance: Well nourished, well developed, and well hydrated. In no apparent acute distress Mental status: Alert, oriented x 3 (person, place, & time)       Respiratory: No evidence of acute respiratory distress Eyes: PERLA Vitals: BP (!) 140/57   Pulse 63   Temp (!) 97.3 F (36.3 C)   Ht 5\' 5"  (1.651 m)   Wt 220 lb (99.8 kg)   SpO2 100%   BMI 36.61 kg/m  BMI: Estimated body mass index is 36.61 kg/m as calculated from the following:   Height as of this encounter: 5\' 5"  (1.651 m).   Weight as of this encounter: 220 lb (99.8 kg). Ideal: Ideal body weight: 57 kg (125 lb 10.6 oz) Adjusted ideal body weight: 74.1 kg (163 lb 6.4 oz)  Assessment   Diagnosis Status  1. Trochanteric bursitis of right hip   2. Myofascial pain   3. Chronic right shoulder pain   4. Primary osteoarthritis of both shoulders   5. Lumbar degenerative disc disease   6. Chronic pain syndrome    Controlled Controlled Controlled    Updated Problems: No problems updated.  Plan of Care  Continue with medication management, follow-up in 3 months  Follow-up plan:   Return in about 3 months (around 04/11/2023) for MM, F2F.      Recent Visits No visits were found meeting these conditions. Showing recent visits within past 90 days and meeting all other requirements Today's Visits Date Type Provider Dept  01/10/23 Office Visit Edward Jolly, MD [facility]  Showing today's visits and meeting all other requirements Future Appointments No visits were found meeting these conditions. Showing future appointments within next 90 days and meeting all other requirements  I discussed the assessment and treatment plan with the patient. The patient was provided an opportunity to ask questions and all were answered. The patient agreed with the plan and demonstrated an understanding of the instructions.  Patient advised to call back or seek an in-person evaluation if the symptoms or condition worsens.  Duration of encounter: .  Total time on encounter, as  per AMA guidelines included both the face-to-face and non-face-to-face time personally spent by the physician and/or other qualified health care professional(s) on the day of the encounter (includes time in activities that require the physician or other qualified health care professional and does not include time in activities normally performed by clinical staff). Physician's time may include the following activities when performed: Preparing to see the patient (e.g., pre-charting review of records, searching for previously ordered imaging, lab work, and nerve conduction tests) Review of prior analgesic pharmacotherapies. Reviewing PMP Interpreting ordered tests (e.g., lab work, imaging, nerve conduction tests) Performing post-procedure evaluations, including interpretation of diagnostic procedures Obtaining and/or reviewing separately obtained  history Performing a medically appropriate examination and/or evaluation Counseling and educating the patient/family/caregiver Ordering medications, tests, or procedures Referring and communicating with other health care professionals (when not separately reported) Documenting clinical information in the electronic or other health record Independently interpreting results (not separately reported) and communicating results to the patient/ family/caregiver Care coordination (not separately reported)  Note by: Edward Jolly, MD Date: 01/10/2023; Time: 2:10 PM

## 2023-03-17 ENCOUNTER — Other Ambulatory Visit: Payer: Self-pay | Admitting: Student in an Organized Health Care Education/Training Program

## 2023-03-17 DIAGNOSIS — G894 Chronic pain syndrome: Secondary | ICD-10-CM

## 2023-04-04 ENCOUNTER — Ambulatory Visit
Payer: Medicare HMO | Attending: Student in an Organized Health Care Education/Training Program | Admitting: Student in an Organized Health Care Education/Training Program

## 2023-04-04 ENCOUNTER — Encounter: Payer: Self-pay | Admitting: Student in an Organized Health Care Education/Training Program

## 2023-04-04 VITALS — BP 106/71 | HR 67 | Temp 97.4°F | Ht 65.0 in | Wt 210.0 lb

## 2023-04-04 DIAGNOSIS — M7061 Trochanteric bursitis, right hip: Secondary | ICD-10-CM | POA: Diagnosis present

## 2023-04-04 DIAGNOSIS — M19012 Primary osteoarthritis, left shoulder: Secondary | ICD-10-CM | POA: Diagnosis present

## 2023-04-04 DIAGNOSIS — M19011 Primary osteoarthritis, right shoulder: Secondary | ICD-10-CM

## 2023-04-04 DIAGNOSIS — M25511 Pain in right shoulder: Secondary | ICD-10-CM | POA: Diagnosis present

## 2023-04-04 DIAGNOSIS — G894 Chronic pain syndrome: Secondary | ICD-10-CM

## 2023-04-04 DIAGNOSIS — G8929 Other chronic pain: Secondary | ICD-10-CM

## 2023-04-04 DIAGNOSIS — M25551 Pain in right hip: Secondary | ICD-10-CM

## 2023-04-04 MED ORDER — HYDROCODONE-ACETAMINOPHEN 7.5-325 MG PO TABS: 1.0000 | ORAL_TABLET | Freq: Three times a day (TID) | ORAL | 0 refills | Status: AC | PRN

## 2023-04-04 MED ORDER — GABAPENTIN 300 MG PO CAPS
300.0000 mg | ORAL_CAPSULE | Freq: Three times a day (TID) | ORAL | 5 refills | Status: DC
Start: 2023-04-04 — End: 2023-10-30

## 2023-04-04 MED ORDER — HYDROCODONE-ACETAMINOPHEN 7.5-325 MG PO TABS: 1.0000 | ORAL_TABLET | Freq: Three times a day (TID) | ORAL | 0 refills | Status: DC | PRN

## 2023-04-04 MED ORDER — BACLOFEN 10 MG PO TABS
10.0000 mg | ORAL_TABLET | Freq: Two times a day (BID) | ORAL | 5 refills | Status: DC | PRN
Start: 2023-04-04 — End: 2023-07-04

## 2023-04-04 NOTE — Progress Notes (Signed)
PROVIDER NOTE: Information contained herein reflects review and annotations entered in association with encounter. Interpretation of such information and data should be left to medically-trained personnel. Information provided to patient can be located elsewhere in the medical record under "Patient Instructions". Document created using STT-dictation technology, any transcriptional errors that may result from process are unintentional.    Patient: Cindy Rose  Service Category: E/M  Provider: Edward Jolly, MD  DOB: 07/12/1945  DOS: 04/04/2023  Referring Provider: Derald Macleod, MD  MRN: [id number]  Specialty: Interventional Pain Management  PCP: Derald Macleod, MD  Type: Established Patient  Setting: Ambulatory outpatient    Location: Office  Delivery: Face-to-face     HPI  Cindy Rose, a 77 y.o. year old female, is here today because of her Chronic pain syndrome [G89.4]. Ms. Moen's primary complain today is Back Pain (Lower back and bilateral knee pain)  Pertinent problems: Ms. Scheuerman has Primary osteoarthritis of both knees; Chronic pain syndrome; History of bilateral knee replacement; Anxiety state; Myofascial pain; Lumbar spondylosis; Bilateral hearing loss; GERD (gastroesophageal reflux disease); Spondylosis of lumbar region without myelopathy or radiculopathy; and Lumbar degenerative disc disease on their pertinent problem list. Pain Assessment: Severity of Chronic pain is reported as a 5 /10. Location: Back (bilateral knees) Upper/radiates to right upper thigh. Onset: More than a month ago. Quality: Burning, Aching, Stabbing. Timing: Constant. Modifying factor(s): meds, ice. Vitals:  height is 5\' 5"  (1.651 m) and weight is 210 lb (95.3 kg). Her temperature is 97.4 F (36.3 C) (abnormal). Her blood pressure is 106/71 and her pulse is 67. Her oxygen saturation is 100%.  BMI: Estimated body mass index is 34.95 kg/m as calculated from the following:   Height as of this  encounter: 5\' 5"  (1.651 m).   Weight as of this encounter: 210 lb (95.3 kg). Last encounter: 01/11/23 Last procedure: Visit date not found.  Reason for encounter: medication management.  No change in medical history since last visit.  Patient's pain is at baseline.  Patient continues multimodal pain regimen as prescribed.  States that it provides pain relief and improvement in functional status. Mild right knee pain, had pain flare last week, improved   Pharmacotherapy Assessment  Analgesic: Hydrocodone 7.5 mg 3 times daily PRN, quantity 90/month, MME equals 22.5   Monitoring: [name] PMP: PDMP reviewed during this encounter.       Pharmacotherapy: No side-effects or adverse reactions reported. Compliance: No problems identified. Effectiveness: Clinically acceptable.  Florina Ou, RN  04/04/2023  2:47 PM  Sign when Signing Visit Safety precautions to be maintained throughout the outpatient stay will include: orient to surroundings, keep bed in low position, maintain call bell within reach at all times, provide assistance with transfer out of bed and ambulation.   Nursing Pain Medication Assessment:  Safety precautions to be maintained throughout the outpatient stay will include: orient to surroundings, keep bed in low position, maintain call bell within reach at all times, provide assistance with transfer out of bed and ambulation.  Medication Inspection Compliance: Pill count conducted under aseptic conditions, in front of the patient. Neither the pills nor the bottle was removed from the patient's sight at any time. Once count was completed pills were immediately returned to the patient in their original bottle.  Medication: Hydrocodone/APAP Pill/Patch Count:  66 of 90 pills remain Pill/Patch Appearance: Markings consistent with prescribed medication Bottle Appearance: Standard pharmacy container. Clearly labeled. Filled Date: 37 / 9 / 2024 Last Medication intake:  Today  No  results found for: "CBDTHCR" No results found for: "D8THCCBX" No results found for: "D9THCCBX"  UDS:  Summary  Date Value Ref Range Status  01/11/2022 Note  Final    Comment:    ==================================================================== [facility] 13 (MW) ==================================================================== Test                             Result       Flag       Units  Drug Present and Declared for Prescription Verification   Hydrocodone                    367          EXPECTED   ng/mg creat   Norhydrocodone                 477          EXPECTED   ng/mg creat    Sources of hydrocodone include scheduled prescription medications.    Norhydrocodone is an expected metabolite of hydrocodone.  ==================================================================== Test                      Result    Flag   Units      Ref Range   Creatinine              30               mg/dL      >=09 ==================================================================== Declared Medications:  The flagging and interpretation on this report are based on the  following declared medications.  Unexpected results may arise from  inaccuracies in the declared medications.   **Note: The testing scope of this panel includes these medications:   Hydrocodone (Norco)   **Note: The testing scope of this panel does not include the  following reported medications:   Acetaminophen (Norco)  Baclofen (Lioresal)  Calcium  Gabapentin (Neurontin)  Hydrochlorothiazide (Hydrodiuril)  Losartan (Cozaar)  Magnesium  Naproxen (Naprosyn)  Omeprazole (Prilosec)  Pravastatin (Pravachol)  Valsartan (Diovan)  Zinc ==================================================================== For clinical consultation, please call [phone]. ====================================================================       ROS  Constitutional: Denies any fever or chills Gastrointestinal: No reported  hemesis, hematochezia, vomiting, or acute GI distress Musculoskeletal: bilateral knee pain, low back pain Neurological: No reported episodes of acute onset apraxia, aphasia, dysarthria, agnosia, amnesia, paralysis, loss of coordination, or loss of consciousness  Medication Review  Calcium-Magnesium-Zinc, HYDROcodone-acetaminophen, baclofen, gabapentin, hydrochlorothiazide, losartan, naproxen, omeprazole, pravastatin, rosuvastatin, and valsartan-hydrochlorothiazide  History Review  Allergy: Ms. Kleintop is allergic to ace inhibitors, other, tape, zoster vaccine live, and seasonal ic [cholestatin]. Drug: Ms. Casali  reports no history of drug use. Alcohol:  reports no history of alcohol use. Tobacco:  reports that she quit smoking about 16 years ago. Her smoking use included cigarettes. She has never used smokeless tobacco. Social: Ms. Kriger  reports that she quit smoking about 16 years ago. Her smoking use included cigarettes. She has never used smokeless tobacco. She reports that she does not drink alcohol and does not use drugs. Medical:  has a past medical history of Allergy, Anxiety, Arthritis, Cancer (HCC) (09/2017), Hyperlipidemia, and Hypertension. Surgical: Ms. Gagnon  has a past surgical history that includes Joint replacement; Abdominal hysterectomy; Wrist surgery; and Hernia repair (2021). Family: family history includes Cancer in her father; Heart disease in her mother.  Laboratory Chemistry Profile   Renal No results found for: "BUN", "  CREATININE", "LABCREA", "BCR", "GFR", "GFRAA", "GFRNONAA", "LABVMA", "EPIRU", "EPINEPH24HUR", "NOREPRU", "NOREPI24HUR", "DOPARU", "DOPAM24HRUR"  Hepatic No results found for: "AST", "ALT", "ALBUMIN", "ALKPHOS", "HCVAB", "AMYLASE", "LIPASE", "AMMONIA"  Electrolytes No results found for: "NA", "K", "CL", "CALCIUM", "MG", "PHOS"  Bone No results found for: "VD25OH", "VD125OH2TOT", "VH8469GE9", "BM8413KG4", "25OHVITD1", "25OHVITD2",  "25OHVITD3", "TESTOFREE", "TESTOSTERONE"  Inflammation (CRP: Acute Phase) (ESR: Chronic Phase) No results found for: "CRP", "ESRSEDRATE", "LATICACIDVEN"       Note: Above Lab results reviewed.  Physical Exam  General appearance: Well nourished, well developed, and well hydrated. In no apparent acute distress Mental status: Alert, oriented x 3 (person, place, & time)       Respiratory: No evidence of acute respiratory distress Eyes: PERLA Vitals: BP 106/71   Pulse 67   Temp (!) 97.4 F (36.3 C)   Ht 5\' 5"  (1.651 m)   Wt 210 lb (95.3 kg)   SpO2 100%   BMI 34.95 kg/m  BMI: Estimated body mass index is 34.95 kg/m as calculated from the following:   Height as of this encounter: 5\' 5"  (1.651 m).   Weight as of this encounter: 210 lb (95.3 kg). Ideal: Ideal body weight: 57 kg (125 lb 10.6 oz) Adjusted ideal body weight: 72.3 kg (159 lb 6.4 oz)  Bilateral knee pain  Assessment   Diagnosis Status  1. Chronic pain syndrome   2. Chronic right shoulder pain   3. Right hip pain   4. Primary osteoarthritis of both shoulders   5. Trochanteric bursitis of right hip    Controlled Controlled Controlled     Plan of Care  Continue with medication management, follow-up in 3 months  Requested Prescriptions   Signed Prescriptions Disp Refills   HYDROcodone-acetaminophen (NORCO) 7.5-325 MG tablet 90 tablet 0    Sig: Take 1 tablet by mouth every 8 (eight) hours as needed.   HYDROcodone-acetaminophen (NORCO) 7.5-325 MG tablet 90 tablet 0    Sig: Take 1 tablet by mouth every 8 (eight) hours as needed.   HYDROcodone-acetaminophen (NORCO) 7.5-325 MG tablet 90 tablet 0    Sig: Take 1 tablet by mouth every 8 (eight) hours as needed.   gabapentin (NEURONTIN) 300 MG capsule 90 capsule 5    Sig: Take 1 capsule (300 mg total) by mouth 3 (three) times daily.   baclofen (LIORESAL) 10 MG tablet 60 tablet 5    Sig: Take 1 tablet (10 mg total) by mouth 2 (two) times daily as needed for muscle  spasms.     Follow-up plan:   Return in about 16 weeks (around 07/25/2023) for MM, F2F.      Recent Visits Date Type Provider Dept  01/10/23 Office Visit Edward Jolly, MD [facility]  Showing recent visits within past 90 days and meeting all other requirements Today's Visits Date Type Provider Dept  04/04/23 Office Visit Edward Jolly, MD [facility]  Showing today's visits and meeting all other requirements Future Appointments No visits were found meeting these conditions. Showing future appointments within next 90 days and meeting all other requirements  I discussed the assessment and treatment plan with the patient. The patient was provided an opportunity to ask questions and all were answered. The patient agreed with the plan and demonstrated an understanding of the instructions.  Patient advised to call back or seek an in-person evaluation if the symptoms or condition worsens.  Duration of encounter: .  Total time on encounter, as per AMA guidelines included both the face-to-face and non-face-to-face time personally  spent by the physician and/or other qualified health care professional(s) on the day of the encounter (includes time in activities that require the physician or other qualified health care professional and does not include time in activities normally performed by clinical staff). Physician's time may include the following activities when performed: Preparing to see the patient (e.g., pre-charting review of records, searching for previously ordered imaging, lab work, and nerve conduction tests) Review of prior analgesic pharmacotherapies. Reviewing PMP Interpreting ordered tests (e.g., lab work, imaging, nerve conduction tests) Performing post-procedure evaluations, including interpretation of diagnostic procedures Obtaining and/or reviewing separately obtained history Performing a medically appropriate examination and/or  evaluation Counseling and educating the patient/family/caregiver Ordering medications, tests, or procedures Referring and communicating with other health care professionals (when not separately reported) Documenting clinical information in the electronic or other health record Independently interpreting results (not separately reported) and communicating results to the patient/ family/caregiver Care coordination (not separately reported)  Note by: Edward Jolly, MD Date: 04/04/2023; Time: 3:03 PM

## 2023-04-04 NOTE — Progress Notes (Signed)
Safety precautions to be maintained throughout the outpatient stay will include: orient to surroundings, keep bed in low position, maintain call bell within reach at all times, provide assistance with transfer out of bed and ambulation.   Nursing Pain Medication Assessment:  Safety precautions to be maintained throughout the outpatient stay will include: orient to surroundings, keep bed in low position, maintain call bell within reach at all times, provide assistance with transfer out of bed and ambulation.  Medication Inspection Compliance: Pill count conducted under aseptic conditions, in front of the patient. Neither the pills nor the bottle was removed from the patient's sight at any time. Once count was completed pills were immediately returned to the patient in their original bottle.  Medication: Hydrocodone/APAP Pill/Patch Count:  66 of 90 pills remain Pill/Patch Appearance: Markings consistent with prescribed medication Bottle Appearance: Standard pharmacy container. Clearly labeled. Filled Date: 53 / 9 / 2024 Last Medication intake:  Today

## 2023-04-07 LAB — TOXASSURE SELECT 13 (MW), URINE

## 2023-07-04 ENCOUNTER — Ambulatory Visit
Payer: Medicare HMO | Attending: Student in an Organized Health Care Education/Training Program | Admitting: Student in an Organized Health Care Education/Training Program

## 2023-07-04 ENCOUNTER — Encounter: Payer: Self-pay | Admitting: Student in an Organized Health Care Education/Training Program

## 2023-07-04 VITALS — BP 119/62 | HR 58 | Temp 98.1°F | Resp 16 | Ht 65.0 in | Wt 208.0 lb

## 2023-07-04 DIAGNOSIS — G894 Chronic pain syndrome: Secondary | ICD-10-CM | POA: Diagnosis present

## 2023-07-04 DIAGNOSIS — M19012 Primary osteoarthritis, left shoulder: Secondary | ICD-10-CM | POA: Insufficient documentation

## 2023-07-04 DIAGNOSIS — M19011 Primary osteoarthritis, right shoulder: Secondary | ICD-10-CM | POA: Diagnosis present

## 2023-07-04 DIAGNOSIS — M47816 Spondylosis without myelopathy or radiculopathy, lumbar region: Secondary | ICD-10-CM | POA: Insufficient documentation

## 2023-07-04 DIAGNOSIS — M7061 Trochanteric bursitis, right hip: Secondary | ICD-10-CM | POA: Insufficient documentation

## 2023-07-04 MED ORDER — BACLOFEN 10 MG PO TABS: 10.0000 mg | ORAL_TABLET | Freq: Two times a day (BID) | ORAL | 5 refills | Status: DC | PRN

## 2023-07-04 MED ORDER — HYDROCODONE-ACETAMINOPHEN 7.5-325 MG PO TABS: 1.0000 | ORAL_TABLET | Freq: Three times a day (TID) | ORAL | 0 refills | Status: AC | PRN

## 2023-07-04 NOTE — Progress Notes (Signed)
 PROVIDER NOTE: Information contained herein reflects review and annotations entered in association with encounter. Interpretation of such information and data should be left to medically-trained personnel. Information provided to patient can be located elsewhere in the medical record under "Patient Instructions". Document created using STT-dictation technology, any transcriptional errors that may result from process are unintentional.    Patient: Cindy Rose  Service Category: E/M  Provider: Edward Jolly, MD  DOB: Jan 22, 1946  DOS: 07/04/2023  Referring Provider: Derald Macleod, MD  MRN: [id number]  Specialty: Interventional Pain Management  PCP: Derald Macleod, MD  Type: Established Patient  Setting: Ambulatory outpatient    Location: Office  Delivery: Face-to-face     HPI  Ms. Chen Holzman, a 78 y.o. year old female, is here today because of her Primary osteoarthritis of both shoulders [M19.011, M19.012]. Ms. Kovacevic's primary complain today is Joint Pain (Generalized arthritis ), Knee Pain (Bilateral joint replacement ), Back Pain (Sometimes upper and sometimes lower ), Shoulder Pain (Right ), and Hip Pain (Right )  Pertinent problems: Ms. Brownstein has Primary osteoarthritis of both knees; Chronic pain syndrome; History of bilateral knee replacement; Anxiety state; Myofascial pain; Lumbar spondylosis; Bilateral hearing loss; GERD (gastroesophageal reflux disease); Spondylosis of lumbar region without myelopathy or radiculopathy; and Lumbar degenerative disc disease on their pertinent problem list. Pain Assessment: Severity of Chronic pain is reported as a 5 /10. Location: Back Lower, Left, Right, Upper/right hip pain down the leg to the knee. Onset: More than a month ago. Quality: Discomfort, Tightness, Pressure, Sore, Sharp, Shooting, Burning (knee pain goes into shins causing soreness). Timing: Intermittent. Modifying factor(s): rest, changing positions a lot, medications. Vitals:  height  is 5\' 5"  (1.651 m) and weight is 208 lb (94.3 kg). Her temporal temperature is 98.1 F (36.7 C). Her blood pressure is 119/62 and her pulse is 58 (abnormal). Her respiration is 16 and oxygen saturation is 98%.  BMI: Estimated body mass index is 34.61 kg/m as calculated from the following:   Height as of this encounter: 5\' 5"  (1.651 m).   Weight as of this encounter: 208 lb (94.3 kg). Last encounter: 04/04/2023 Last procedure: Visit date not found.  Reason for encounter: medication management.  No change in medical history since last visit.  Patient's pain is at baseline.  Patient continues multimodal pain regimen as prescribed.  States that it provides pain relief and improvement in functional status. Patient continues to work 3 days a week.  Pharmacotherapy Assessment  Analgesic: Hydrocodone 7.5 mg 3 times daily PRN, quantity 90/month, MME equals 22.5   Monitoring: [place] PMP: PDMP reviewed during this encounter.       Pharmacotherapy: No side-effects or adverse reactions reported. Compliance: No problems identified. Effectiveness: Clinically acceptable.  Vernie Ammons, RN  07/04/2023  3:00 PM  Sign when Signing Visit Nursing Pain Medication Assessment:  Safety precautions to be maintained throughout the outpatient stay will include: orient to surroundings, keep bed in low position, maintain call bell within reach at all times, provide assistance with transfer out of bed and ambulation.  Medication Inspection Compliance: Pill count conducted under aseptic conditions, in front of the patient. Neither the pills nor the bottle was removed from the patient's sight at any time. Once count was completed pills were immediately returned to the patient in their original bottle.  Medication: Hydrocodone/APAP Pill/Patch Count:  65 of 90 pills remain Pill/Patch Appearance: Markings consistent with prescribed medication Bottle Appearance: Standard pharmacy container. Clearly labeled. Filled  Date: 03 / 10 / 2025  Last Medication intake:  Today  No results found for: "CBDTHCR" No results found for: "D8THCCBX" No results found for: "D9THCCBX"  UDS:  Summary  Date Value Ref Range Status  04/04/2023 FINAL  Final    Comment:    ==================================================================== [facility] (MW) ==================================================================== Test                             Result       Flag       Units  Drug Present and Declared for Prescription Verification   Hydrocodone                    734          EXPECTED   ng/mg creat   Norhydrocodone                 834          EXPECTED   ng/mg creat    Sources of hydrocodone include scheduled prescription medications.    Norhydrocodone is an expected metabolite of hydrocodone.  ==================================================================== Test                      Result    Flag   Units      Ref Range   Creatinine              44               mg/dL      >=62 ==================================================================== Declared Medications:  The flagging and interpretation on this report are based on the  following declared medications.  Unexpected results may arise from  inaccuracies in the declared medications.   **Note: The testing scope of this panel includes these medications:   Hydrocodone (Norco)   **Note: The testing scope of this panel does not include the  following reported medications:   Acetaminophen (Norco)  Baclofen (Lioresal)  Calcium  Gabapentin (Neurontin)  Hydrochlorothiazide (Hydrodiuril)  Losartan (Cozaar)  Magnesium  Naproxen (Naprosyn)  Omeprazole (Prilosec)  Pravastatin (Pravachol)  Rosuvastatin (Crestor)  Valsartan (Diovan)  Zinc ==================================================================== For clinical consultation, please call [phone]. ====================================================================        ROS  Constitutional: Denies any fever or chills Gastrointestinal: No reported hemesis, hematochezia, vomiting, or acute GI distress Musculoskeletal: bilateral knee pain, low back pain Neurological: No reported episodes of acute onset apraxia, aphasia, dysarthria, agnosia, amnesia, paralysis, loss of coordination, or loss of consciousness  Medication Review  Calcium-Magnesium-Zinc, HYDROcodone-acetaminophen, baclofen, diclofenac Sodium, gabapentin, hydrochlorothiazide, losartan, naproxen, omeprazole, pravastatin, and valsartan-hydrochlorothiazide  History Review  Allergy: Ms. Sobol is allergic to ace inhibitors, other, tape, zoster vaccine live, and seasonal ic [cholestatin]. Drug: Ms. Helinski  reports no history of drug use. Alcohol:  reports no history of alcohol use. Tobacco:  reports that she quit smoking about 16 years ago. Her smoking use included cigarettes. She has never used smokeless tobacco. Social: Ms. Pangle  reports that she quit smoking about 16 years ago. Her smoking use included cigarettes. She has never used smokeless tobacco. She reports that she does not drink alcohol and does not use drugs. Medical:  has a past medical history of Allergy, Anxiety, Arthritis, Cancer (HCC) (09/2017), Hyperlipidemia, and Hypertension. Surgical: Ms. Frees  has a past surgical history that includes Joint replacement; Abdominal hysterectomy; Wrist surgery; and Hernia repair (2021). Family: family history includes Cancer in her father; Heart disease in her mother.  Laboratory Chemistry  Profile   Renal No results found for: "BUN", "CREATININE", "LABCREA", "BCR", "GFR", "GFRAA", "GFRNONAA", "LABVMA", "EPIRU", "EPINEPH24HUR", "NOREPRU", "NOREPI24HUR", "DOPARU", "DOPAM24HRUR"  Hepatic No results found for: "AST", "ALT", "ALBUMIN", "ALKPHOS", "HCVAB", "AMYLASE", "LIPASE", "AMMONIA"  Electrolytes No results found for: "NA", "K", "CL", "CALCIUM", "MG", "PHOS"  Bone No results found  for: "VD25OH", "VD125OH2TOT", "MW1027OZ3", "GU4403KV4", "25OHVITD1", "25OHVITD2", "25OHVITD3", "TESTOFREE", "TESTOSTERONE"  Inflammation (CRP: Acute Phase) (ESR: Chronic Phase) No results found for: "CRP", "ESRSEDRATE", "LATICACIDVEN"       Note: Above Lab results reviewed.  Physical Exam  General appearance: Well nourished, well developed, and well hydrated. In no apparent acute distress Mental status: Alert, oriented x 3 (person, place, & time)       Respiratory: No evidence of acute respiratory distress Eyes: PERLA Vitals: BP 119/62 (BP Location: Right Arm, Patient Position: Sitting, Cuff Size: Large)   Pulse (!) 58   Temp 98.1 F (36.7 C) (Temporal)   Resp 16   Ht 5\' 5"  (1.651 m)   Wt 208 lb (94.3 kg)   SpO2 98%   BMI 34.61 kg/m  BMI: Estimated body mass index is 34.61 kg/m as calculated from the following:   Height as of this encounter: 5\' 5"  (1.651 m).   Weight as of this encounter: 208 lb (94.3 kg). Ideal: Ideal body weight: 57 kg (125 lb 10.6 oz) Adjusted ideal body weight: 71.9 kg (158 lb 9.6 oz) Bilateral shoulder pain Bilateral knee pain  Assessment   Diagnosis Status  1. Primary osteoarthritis of both shoulders   2. Chronic pain syndrome   3. Trochanteric bursitis of right hip   4. Lumbar spondylosis    Controlled Controlled Controlled     Plan of Care  Continue with medication management, follow-up in 3 months  Requested Prescriptions   Signed Prescriptions Disp Refills   HYDROcodone-acetaminophen (NORCO) 7.5-325 MG tablet 90 tablet 0    Sig: Take 1 tablet by mouth every 8 (eight) hours as needed.   HYDROcodone-acetaminophen (NORCO) 7.5-325 MG tablet 90 tablet 0    Sig: Take 1 tablet by mouth every 8 (eight) hours as needed.   HYDROcodone-acetaminophen (NORCO) 7.5-325 MG tablet 90 tablet 0    Sig: Take 1 tablet by mouth every 8 (eight) hours as needed.   baclofen (LIORESAL) 10 MG tablet 60 tablet 5    Sig: Take 1 tablet (10 mg total) by mouth 2  (two) times daily as needed for muscle spasms.  Continue gabapentin as prescribed, no refills needed   Follow-up plan:   Return in about 3 months (around 10/04/2023) for MM, F2F.      Recent Visits No visits were found meeting these conditions. Showing recent visits within past 90 days and meeting all other requirements Today's Visits Date Type Provider Dept  07/04/23 Office Visit Edward Jolly, MD [facility]  Showing today's visits and meeting all other requirements Future Appointments No visits were found meeting these conditions. Showing future appointments within next 90 days and meeting all other requirements  I discussed the assessment and treatment plan with the patient. The patient was provided an opportunity to ask questions and all were answered. The patient agreed with the plan and demonstrated an understanding of the instructions.  Patient advised to call back or seek an in-person evaluation if the symptoms or condition worsens.  Duration of encounter: .  Total time on encounter, as per AMA guidelines included both the face-to-face and non-face-to-face time personally spent by the physician and/or other qualified health care professional(s)  on the day of the encounter (includes time in activities that require the physician or other qualified health care professional and does not include time in activities normally performed by clinical staff). Physician's time may include the following activities when performed: Preparing to see the patient (e.g., pre-charting review of records, searching for previously ordered imaging, lab work, and nerve conduction tests) Review of prior analgesic pharmacotherapies. Reviewing PMP Interpreting ordered tests (e.g., lab work, imaging, nerve conduction tests) Performing post-procedure evaluations, including interpretation of diagnostic procedures Obtaining and/or reviewing separately obtained history Performing a medically  appropriate examination and/or evaluation Counseling and educating the patient/family/caregiver Ordering medications, tests, or procedures Referring and communicating with other health care professionals (when not separately reported) Documenting clinical information in the electronic or other health record Independently interpreting results (not separately reported) and communicating results to the patient/ family/caregiver Care coordination (not separately reported)  Note by: Edward Jolly, MD Date: 07/04/2023; Time: 3:46 PM

## 2023-07-04 NOTE — Patient Instructions (Signed)

## 2023-07-04 NOTE — Progress Notes (Signed)
 Nursing Pain Medication Assessment:  Safety precautions to be maintained throughout the outpatient stay will include: orient to surroundings, keep bed in low position, maintain call bell within reach at all times, provide assistance with transfer out of bed and ambulation.  Medication Inspection Compliance: Pill count conducted under aseptic conditions, in front of the patient. Neither the pills nor the bottle was removed from the patient's sight at any time. Once count was completed pills were immediately returned to the patient in their original bottle.  Medication: Hydrocodone/APAP Pill/Patch Count:  65 of 90 pills remain Pill/Patch Appearance: Markings consistent with prescribed medication Bottle Appearance: Standard pharmacy container. Clearly labeled. Filled Date: 03 / 10 / 2025 Last Medication intake:  Today

## 2023-08-23 ENCOUNTER — Telehealth: Payer: Self-pay

## 2023-08-23 NOTE — Telephone Encounter (Signed)
 They faxed a medication review over a month ago and have gotten no feedback. They need someone to call them.

## 2023-08-23 NOTE — Telephone Encounter (Signed)
They are faxing it again

## 2023-10-17 ENCOUNTER — Encounter: Admitting: Nurse Practitioner

## 2023-10-24 ENCOUNTER — Other Ambulatory Visit: Payer: Self-pay | Admitting: Student in an Organized Health Care Education/Training Program

## 2023-10-24 DIAGNOSIS — G894 Chronic pain syndrome: Secondary | ICD-10-CM

## 2023-10-31 ENCOUNTER — Ambulatory Visit: Attending: Nurse Practitioner | Admitting: Nurse Practitioner

## 2023-10-31 ENCOUNTER — Encounter: Payer: Self-pay | Admitting: Nurse Practitioner

## 2023-10-31 VITALS — BP 132/76 | HR 61 | Resp 18 | Ht 64.5 in | Wt 202.0 lb

## 2023-10-31 DIAGNOSIS — G894 Chronic pain syndrome: Secondary | ICD-10-CM | POA: Diagnosis present

## 2023-10-31 DIAGNOSIS — M19011 Primary osteoarthritis, right shoulder: Secondary | ICD-10-CM | POA: Insufficient documentation

## 2023-10-31 DIAGNOSIS — M7918 Myalgia, other site: Secondary | ICD-10-CM | POA: Insufficient documentation

## 2023-10-31 DIAGNOSIS — G8929 Other chronic pain: Secondary | ICD-10-CM | POA: Insufficient documentation

## 2023-10-31 DIAGNOSIS — M7061 Trochanteric bursitis, right hip: Secondary | ICD-10-CM | POA: Diagnosis present

## 2023-10-31 DIAGNOSIS — M19012 Primary osteoarthritis, left shoulder: Secondary | ICD-10-CM | POA: Insufficient documentation

## 2023-10-31 DIAGNOSIS — M25511 Pain in right shoulder: Secondary | ICD-10-CM | POA: Insufficient documentation

## 2023-10-31 DIAGNOSIS — M25551 Pain in right hip: Secondary | ICD-10-CM | POA: Diagnosis present

## 2023-10-31 DIAGNOSIS — M47816 Spondylosis without myelopathy or radiculopathy, lumbar region: Secondary | ICD-10-CM | POA: Insufficient documentation

## 2023-10-31 MED ORDER — GABAPENTIN 300 MG PO CAPS: 300.0000 mg | ORAL_CAPSULE | Freq: Three times a day (TID) | ORAL | 5 refills | Status: DC

## 2023-10-31 MED ORDER — HYDROCODONE-ACETAMINOPHEN 7.5-325 MG PO TABS
1.0000 | ORAL_TABLET | Freq: Three times a day (TID) | ORAL | 0 refills | Status: DC | PRN
Start: 2023-10-31 — End: 2023-11-06

## 2023-10-31 MED ORDER — HYDROCODONE-ACETAMINOPHEN 7.5-325 MG PO TABS: 1.0000 | ORAL_TABLET | Freq: Three times a day (TID) | ORAL | 0 refills | Status: DC | PRN

## 2023-10-31 MED ORDER — HYDROCODONE-ACETAMINOPHEN 7.5-325 MG PO TABS
1.0000 | ORAL_TABLET | Freq: Three times a day (TID) | ORAL | 0 refills | Status: DC | PRN
Start: 2023-12-30 — End: 2023-11-06

## 2023-10-31 NOTE — Progress Notes (Signed)
 PROVIDER NOTE: Interpretation of information contained herein should be left to medically-trained personnel. Specific patient instructions are provided elsewhere under Patient Instructions section of medical record. This document was created in part using AI and STT-dictation technology, any transcriptional errors that may result from this process are unintentional.  Patient: Cindy Rose  Service: E/M   PCP: Arlyss Fallow, MD  DOB: Jun 10, 1945  DOS: 10/31/2023  Provider: Emmy MARLA Blanch, NP  MRN: [id number]  Delivery: Face-to-face  Specialty: Interventional Pain Management  Type: Established Patient  Setting: Ambulatory outpatient facility  Specialty designation: 09  Referring Prov.: Arlyss Fallow, MD  Location: Outpatient office facility       History of present illness (HPI) [name], a 78 y.o. year old female, is here today because of her Chronic pain syndrome [G89.4]. [name]'s primary complain today is Shoulder Pain (Radiates down right arm )  Pertinent problems: [name] has Primary osteoarthritis of both knees; History of bilateral knee replacement; Myofascial pain; Lumbar spondylosis; Lumbar degenerative disc disease; and Chronic pain syndrome on their pertinent problem list  Pain Assessment: Severity of Chronic pain is reported as a 7 /10. Location: Shoulder Right, Left, Upper/Right worse than left. radiate down right arm. Onset: More than a month ago. Quality: Numbness, Tingling, Sharp. Timing: Intermittent. Modifying factor(s): Medications. Vitals:  height is 5' 4.5 (1.638 m) and weight is 202 lb (91.6 kg). Her blood pressure is 132/76 and her pulse is 61. Her respiration is 18 and oxygen saturation is 99%.  BMI: Estimated body mass index is 34.14 kg/m as calculated from the following:   Height as of this encounter: 5' 4.5 (1.638 m).   Weight as of this encounter: 202 lb (91.6 kg).  Last encounter: 07/04/2023 Last procedure: Visit date not found.  Reason  for encounter: medication management. No change in medical history since last visit.  Patient's pain is at baseline.  Patient continues multimodal pain regimen as prescribed.  States that it provides pain relief and improvement in functional status.   Pharmacotherapy Assessment   Hydrocodone -acetaminophen  (Norco) 7.5-325 mg tablet every 8 hours as needed for moderate pain. MME=22.50 Gabapentin  (Neurontin ) 300 mg capsule 3 times daily Monitoring: [place] PMP: PDMP reviewed during this encounter.       Pharmacotherapy: No side-effects or adverse reactions reported. Compliance: No problems identified. Effectiveness: Clinically acceptable.  Cindy Rose, [place]  10/31/2023  3:41 PM  Sign when Signing Visit Nursing Pain Medication Assessment:  Safety precautions to be maintained throughout the outpatient stay will include: orient to surroundings, keep bed in low position, maintain call bell within reach at all times, provide assistance with transfer out of bed and ambulation.  Medication Inspection Compliance: Pill count conducted under aseptic conditions, in front of the patient. Neither the pills nor the bottle was removed from the patient's sight at any time. Once count was completed pills were immediately returned to the patient in their original bottle.  Medication: Hydrocodone /APAP Pill/Patch Count: 0 of 90 pills/patches remain Pill/Patch Appearance:Markings consistent with prescribed medication Bottle Appearance: Standard pharmacy container. Clearly labeled. Filled Date: 06 / 09 / 2025 Last Medication intake:  Yesterday    UDS:  Summary  Date Value Ref Range Status  04/04/2023 FINAL  Final    Comment:    ==================================================================== [facility] (MW) ==================================================================== Test                             Result  Flag       Units  Drug Present and Declared for Prescription Verification    Hydrocodone                     734          EXPECTED   ng/mg creat   Norhydrocodone                 834          EXPECTED   ng/mg creat    Sources of hydrocodone  include scheduled prescription medications.    Norhydrocodone is an expected metabolite of hydrocodone .  ==================================================================== Test                      Result    Flag   Units      Ref Range   Creatinine              44               mg/dL      >=79 ==================================================================== Declared Medications:  The flagging and interpretation on this report are based on the  following declared medications.  Unexpected results may arise from  inaccuracies in the declared medications.   **Note: The testing scope of this panel includes these medications:   Hydrocodone  (Norco)   **Note: The testing scope of this panel does not include the  following reported medications:   Acetaminophen  (Norco)  Baclofen  (Lioresal )  Calcium  Gabapentin  (Neurontin )  Hydrochlorothiazide (Hydrodiuril)  Losartan (Cozaar)  Magnesium  Naproxen (Naprosyn)  Omeprazole (Prilosec)  Pravastatin (Pravachol)  Rosuvastatin (Crestor)  Valsartan (Diovan)  Zinc ==================================================================== For clinical consultation, please call [phone]. ====================================================================     No results found for: CBDTHCR No results found for: D8THCCBX No results found for: D9THCCBX  ROS  Constitutional: Denies any fever or chills Gastrointestinal: No reported hemesis, hematochezia, vomiting, or acute GI distress Musculoskeletal: Shoulder pain radiates down to the right arm Neurological: No reported episodes of acute onset apraxia, aphasia, dysarthria, agnosia, amnesia, paralysis, loss of coordination, or loss of consciousness  Medication Review  Calcium-Magnesium-Zinc, HYDROcodone -acetaminophen ,  baclofen , diclofenac Sodium, gabapentin , hydrochlorothiazide, losartan, naproxen, omeprazole, pravastatin, and valsartan-hydrochlorothiazide  History Review  Allergy: [name] is allergic to ace inhibitors, other, tape, zoster vaccine live, and seasonal ic [cholestatin]. Drug: Ms. Nader  reports no history of drug use. Alcohol:  reports no history of alcohol use. Tobacco:  reports that she quit smoking about 16 years ago. Her smoking use included cigarettes. She has never used smokeless tobacco. Social: Ms. Schewe  reports that she quit smoking about 16 years ago. Her smoking use included cigarettes. She has never used smokeless tobacco. She reports that she does not drink alcohol and does not use drugs. Medical:  has a past medical history of Allergy, Anxiety, Arthritis, Cancer (HCC) (09/2017), Hyperlipidemia, and Hypertension. Surgical: Ms. Erlich  has a past surgical history that includes Joint replacement; Abdominal hysterectomy; Wrist surgery; and Hernia repair (2021). Family: family history includes Cancer in her father; Heart disease in her mother.  Laboratory Chemistry Profile   Renal No results found for: BUN, CREATININE, LABCREA, BCR, GFR, GFRAA, GFRNONAA, LABVMA, EPIRU, EPINEPH24HUR, NOREPRU, NOREPI24HUR, DOPARU, [id number]  Hepatic No results found for: AST, ALT, ALBUMIN, ALKPHOS, HCVAB, AMYLASE, LIPASE, AMMONIA  Electrolytes No results found for: NA, K, CL, CALCIUM, MG, PHOS  Bone No results found for: VD25OH, [id number], CI6874NY7, CI7874NY7, 25OHVITD1, 25OHVITD2, 25OHVITD3, TESTOFREE, TESTOSTERONE  Inflammation (CRP: Acute Phase) (  ESR: Chronic Phase) No results found for: CRP, ESRSEDRATE, LATICACIDVEN       Note: Above Lab results reviewed.  Recent Imaging Review  DG PAIN CLINIC C-ARM 1-60 MIN NO REPORT Fluoro was used, but no Radiologist interpretation will be provided.   Please refer to NOTES tab for provider progress note. Note: Reviewed        Physical Exam  Vitals: BP 132/76   Pulse 61   Resp 18   Ht 5' 4.5 (1.638 m)   Wt 202 lb (91.6 kg)   SpO2 99%   BMI 34.14 kg/m  BMI: Estimated body mass index is 34.14 kg/m as calculated from the following:   Height as of this encounter: 5' 4.5 (1.638 m).   Weight as of this encounter: 202 lb (91.6 kg). Ideal: Ideal body weight: 55.8 kg (123 lb 2 oz) Adjusted ideal body weight: 70.2 kg (154 lb 10.8 oz) General appearance: Well nourished, well developed, and well hydrated. In no apparent acute distress Mental status: Alert, oriented x 3 (person, place, & time)       Respiratory: No evidence of acute respiratory distress Eyes: PERLA   Assessment   Diagnosis Status  1. Chronic pain syndrome   2. Primary osteoarthritis of both shoulders   3. Trochanteric bursitis of right hip   4. Lumbar spondylosis   5. Chronic right shoulder pain   6. Right hip pain   7. Myofascial pain    Controlled Controlled Controlled   Updated Problems: No problems updated.  Plan of Care  Problem-specific:  Assessment and Plan We will continue on current medication regimen.  Prescribing drug monitoring (PDMP) reviewed; findings consistent with the use of prescribed medication and no evidence of narcotic misuse or abuse.  No other new issues or concerns reported to this visit.  Schedule follow-up in 90 days for medication management.   Ms. Raimi Guillermo has a current medication list which includes the following long-term medication(s): hydrochlorothiazide, omeprazole, pravastatin, valsartan-hydrochlorothiazide, and gabapentin .  Pharmacotherapy (Medications Ordered): Meds ordered this encounter  Medications   gabapentin  (NEURONTIN ) 300 MG capsule    Sig: Take 1 capsule (300 mg total) by mouth 3 (three) times daily.    Dispense:  90 capsule    Refill:  5   HYDROcodone -acetaminophen  (NORCO) 7.5-325 MG tablet     Sig: Take 1 tablet by mouth every 8 (eight) hours as needed for moderate pain (pain score 4-6). Must last 30 days    Dispense:  30 tablet    Refill:  0   HYDROcodone -acetaminophen  (NORCO) 7.5-325 MG tablet    Sig: Take 1 tablet by mouth every 8 (eight) hours as needed for moderate pain (pain score 4-6). Must last 30 days    Dispense:  30 tablet    Refill:  0   HYDROcodone -acetaminophen  (NORCO) 7.5-325 MG tablet    Sig: Take 1 tablet by mouth every 8 (eight) hours as needed for moderate pain (pain score 4-6). Must last 30 days    Dispense:  30 tablet    Refill:  0   Orders:  No orders of the defined types were placed in this encounter.       Return in about 3 months (around 01/31/2024) for (F2F), (MM), Cindy Blanch NP.    Recent Visits No visits were found meeting these conditions. Showing recent visits within past 90 days and meeting all other requirements Today's Visits Date Type Provider Dept  10/31/23 Office Visit Koltyn Kelsay K, NP [facility]  Showing  today's visits and meeting all other requirements Future Appointments No visits were found meeting these conditions. Showing future appointments within next 90 days and meeting all other requirements  I discussed the assessment and treatment plan with the patient. The patient was provided an opportunity to ask questions and all were answered. The patient agreed with the plan and demonstrated an understanding of the instructions.  Patient advised to call back or seek an in-person evaluation if the symptoms or condition worsens.  Duration of encounter: 30 minutes.  Total time on encounter, as per AMA guidelines included both the face-to-face and non-face-to-face time personally spent by the physician and/or other qualified health care professional(s) on the day of the encounter (includes time in activities that require the physician or other qualified health care professional and does not include time in activities  normally performed by clinical staff). Physician's time may include the following activities when performed: Preparing to see the patient (e.g., pre-charting review of records, searching for previously ordered imaging, lab work, and nerve conduction tests) Review of prior analgesic pharmacotherapies. Reviewing PMP Interpreting ordered tests (e.g., lab work, imaging, nerve conduction tests) Performing post-procedure evaluations, including interpretation of diagnostic procedures Obtaining and/or reviewing separately obtained history Performing a medically appropriate examination and/or evaluation Counseling and educating the patient/family/caregiver Ordering medications, tests, or procedures Referring and communicating with other health care professionals (when not separately reported) Documenting clinical information in the electronic or other health record Independently interpreting results (not separately reported) and communicating results to the patient/ family/caregiver Care coordination (not separately reported)  Note by: Charon Akamine K Estel Tonelli, NP (TTS and AI technology used. I apologize for any typographical errors that were not detected and corrected.) Date: 10/31/2023; Time: 3:43 PM

## 2023-10-31 NOTE — Progress Notes (Signed)
 Nursing Pain Medication Assessment:  Safety precautions to be maintained throughout the outpatient stay will include: orient to surroundings, keep bed in low position, maintain call bell within reach at all times, provide assistance with transfer out of bed and ambulation.  Medication Inspection Compliance: Pill count conducted under aseptic conditions, in front of the patient. Neither the pills nor the bottle was removed from the patient's sight at any time. Once count was completed pills were immediately returned to the patient in their original bottle.  Medication: Hydrocodone /APAP Pill/Patch Count: 0 of 90 pills/patches remain Pill/Patch Appearance:Markings consistent with prescribed medication Bottle Appearance: Standard pharmacy container. Clearly labeled. Filled Date: 06 / 09 / 2025 Last Medication intake:  Yesterday

## 2023-11-03 ENCOUNTER — Telehealth: Payer: Self-pay | Admitting: Nurse Practitioner

## 2023-11-03 NOTE — Telephone Encounter (Signed)
Attempted to call patient, went to voicemail.

## 2023-11-03 NOTE — Telephone Encounter (Signed)
 Has some questions about her medications. Please call patient

## 2023-11-06 ENCOUNTER — Other Ambulatory Visit: Payer: Self-pay | Admitting: Nurse Practitioner

## 2023-11-06 ENCOUNTER — Telehealth: Payer: Self-pay

## 2023-11-06 DIAGNOSIS — M7061 Trochanteric bursitis, right hip: Secondary | ICD-10-CM

## 2023-11-06 DIAGNOSIS — M19011 Primary osteoarthritis, right shoulder: Secondary | ICD-10-CM

## 2023-11-06 DIAGNOSIS — G894 Chronic pain syndrome: Secondary | ICD-10-CM

## 2023-11-06 DIAGNOSIS — M47816 Spondylosis without myelopathy or radiculopathy, lumbar region: Secondary | ICD-10-CM

## 2023-11-06 DIAGNOSIS — Z79899 Other long term (current) drug therapy: Secondary | ICD-10-CM

## 2023-11-06 DIAGNOSIS — G8929 Other chronic pain: Secondary | ICD-10-CM

## 2023-11-06 MED ORDER — HYDROCODONE-ACETAMINOPHEN 7.5-325 MG PO TABS: 1.0000 | ORAL_TABLET | Freq: Three times a day (TID) | ORAL | 0 refills | Status: DC | PRN

## 2023-11-06 MED ORDER — HYDROCODONE-ACETAMINOPHEN 7.5-325 MG PO TABS: 1.0000 | ORAL_TABLET | Freq: Three times a day (TID) | ORAL | 0 refills | Status: AC | PRN

## 2023-11-06 MED ORDER — HYDROCODONE-ACETAMINOPHEN 7.5-325 MG PO TABS
1.0000 | ORAL_TABLET | Freq: Three times a day (TID) | ORAL | 0 refills | Status: AC | PRN
Start: 2023-11-12 — End: 2023-12-12

## 2023-11-06 NOTE — Telephone Encounter (Signed)
 Called patient, left voicemail to call back if she still had questions or concerns.

## 2023-11-06 NOTE — Telephone Encounter (Signed)
Attempted to call patient, voicemail left 

## 2023-11-06 NOTE — Telephone Encounter (Signed)
 Telephone call from patient stating that she was seen on 10/31/2023 for medication refill. Patient states that prior to this visit she was receiving 90 tablets of Hydrocodone /APAP 7.5-325 per month. ( Verified on med list) Patient reports that when she picked up her medication the Rx was for Hydrocodone /APAP 7.5-325 30 tablets for moderate pain 4-6 Q8hrs.  Patient is asking why her pain medication was decreased, since her pain level stays around a 7.

## 2023-11-06 NOTE — Telephone Encounter (Signed)
 Spoke with patient, message sent to [facility] for clarification.

## 2023-11-09 ENCOUNTER — Telehealth: Payer: Self-pay | Admitting: Neurology

## 2023-11-09 NOTE — Telephone Encounter (Signed)
 error

## 2023-12-25 ENCOUNTER — Other Ambulatory Visit: Payer: Self-pay | Admitting: Student in an Organized Health Care Education/Training Program

## 2023-12-25 DIAGNOSIS — G894 Chronic pain syndrome: Secondary | ICD-10-CM

## 2024-01-04 ENCOUNTER — Other Ambulatory Visit: Payer: Self-pay | Admitting: *Deleted

## 2024-01-04 ENCOUNTER — Telehealth: Payer: Self-pay | Admitting: Student in an Organized Health Care Education/Training Program

## 2024-01-04 DIAGNOSIS — G894 Chronic pain syndrome: Secondary | ICD-10-CM

## 2024-01-04 MED ORDER — BACLOFEN 10 MG PO TABS: 10.0000 mg | ORAL_TABLET | Freq: Two times a day (BID) | ORAL | 5 refills | Status: DC | PRN

## 2024-01-04 NOTE — Telephone Encounter (Signed)
 Patient called stated that she need baclofen  to be send to pharmacy. PT stated that the prescription hasn't been send in. Please give patient a call. TY

## 2024-01-04 NOTE — Telephone Encounter (Signed)
 Medication refill request sent to [facility]

## 2024-01-30 ENCOUNTER — Encounter: Admitting: Nurse Practitioner

## 2024-02-06 ENCOUNTER — Ambulatory Visit
Attending: Student in an Organized Health Care Education/Training Program | Admitting: Student in an Organized Health Care Education/Training Program

## 2024-02-06 ENCOUNTER — Encounter: Payer: Self-pay | Admitting: Student in an Organized Health Care Education/Training Program

## 2024-02-06 VITALS — BP 134/74 | HR 64 | Temp 98.2°F | Resp 18 | Ht 65.0 in | Wt 200.0 lb

## 2024-02-06 DIAGNOSIS — Z79899 Other long term (current) drug therapy: Secondary | ICD-10-CM | POA: Insufficient documentation

## 2024-02-06 DIAGNOSIS — M7061 Trochanteric bursitis, right hip: Secondary | ICD-10-CM | POA: Diagnosis present

## 2024-02-06 DIAGNOSIS — M25511 Pain in right shoulder: Secondary | ICD-10-CM | POA: Diagnosis present

## 2024-02-06 DIAGNOSIS — G8929 Other chronic pain: Secondary | ICD-10-CM | POA: Insufficient documentation

## 2024-02-06 DIAGNOSIS — M19012 Primary osteoarthritis, left shoulder: Secondary | ICD-10-CM | POA: Diagnosis not present

## 2024-02-06 DIAGNOSIS — M47816 Spondylosis without myelopathy or radiculopathy, lumbar region: Secondary | ICD-10-CM | POA: Insufficient documentation

## 2024-02-06 DIAGNOSIS — M19011 Primary osteoarthritis, right shoulder: Secondary | ICD-10-CM | POA: Insufficient documentation

## 2024-02-06 DIAGNOSIS — G894 Chronic pain syndrome: Secondary | ICD-10-CM | POA: Diagnosis not present

## 2024-02-06 MED ORDER — HYDROCODONE-ACETAMINOPHEN 7.5-325 MG PO TABS: 1.0000 | ORAL_TABLET | Freq: Three times a day (TID) | ORAL | 0 refills | Status: AC | PRN

## 2024-02-06 MED ORDER — HYDROCODONE-ACETAMINOPHEN 7.5-325 MG PO TABS: 1.0000 | ORAL_TABLET | Freq: Three times a day (TID) | ORAL | 0 refills | Status: DC | PRN

## 2024-02-06 NOTE — Progress Notes (Signed)
 Nursing Pain Medication Assessment:  Safety precautions to be maintained throughout the outpatient stay will include: orient to surroundings, keep bed in low position, maintain call bell within reach at all times, provide assistance with transfer out of bed and ambulation.  Medication Inspection Compliance: Pill count conducted under aseptic conditions, in front of the patient. Neither the pills nor the bottle was removed from the patient's sight at any time. Once count was completed pills were immediately returned to the patient in their original bottle.  Medication: Hydrocodone /APAP Pill/Patch Count: 15 of 90 pills/patches remain Pill/Patch Appearance: Markings consistent with prescribed medication Bottle Appearance: Standard pharmacy container. Clearly labeled. Filled Date: 09 / 25 / 2025 Last Medication intake:  Today

## 2024-02-06 NOTE — Progress Notes (Signed)
 PROVIDER NOTE: Information contained herein reflects review and annotations entered in association with encounter. Interpretation of such information and data should be left to medically-trained personnel. Information provided to patient can be located elsewhere in the medical record under Patient Instructions. Document created using STT-dictation technology, any transcriptional errors that may result from process are unintentional.    Patient: Cindy Rose  Service Category: E/M  Provider: Wallie Sherry, MD  DOB: 05-17-1945  DOS: 02/06/2024  Referring Provider: Arlyss Fallow, MD  MRN: [id number]  Specialty: Interventional Pain Management  PCP: Arlyss Fallow, MD  Type: Established Patient  Setting: Ambulatory outpatient    Location: Office  Delivery: Face-to-face     HPI  [name], a 78 y.o. year old female, is here today because of her Chronic pain syndrome [G89.4]. [name]'s primary complain today is Back Pain  Pertinent problems: [name] has Primary osteoarthritis of both knees; Chronic pain syndrome; History of bilateral knee replacement; Anxiety state; Myofascial pain; Lumbar spondylosis; Bilateral hearing loss; GERD (gastroesophageal reflux disease); Spondylosis of lumbar region without myelopathy or radiculopathy; and Lumbar degenerative disc disease on their pertinent problem list. Pain Assessment: Severity of Chronic pain is reported as a 7 /10. Location: Back Mid, Lower/Denies. Onset: More than a month ago. Quality: Constant. Timing: Constant. Modifying factor(s): Ice. Vitals:  height is 5' 5 (1.651 m) and weight is 200 lb (90.7 kg). Her temporal temperature is 98.2 F (36.8 C). Her blood pressure is 134/74 and her pulse is 64. Her respiration is 18 and oxygen saturation is 95%.  BMI: Estimated body mass index is 33.28 kg/m as calculated from the following:   Height as of this encounter: 5' 5 (1.651 m).   Weight as of this encounter: 200 lb (90.7 kg). Last  encounter: 04/04/2023 Last procedure: Visit date not found.  Reason for encounter: medication management.  No change in medical history since last visit.  Patient's pain is at baseline.  Patient continues multimodal pain regimen as prescribed.  States that it provides pain relief and improvement in functional status. Patient continues to work 3 days a week.  Pharmacotherapy Assessment  Analgesic: Hydrocodone  7.5 mg 3 times daily PRN, quantity 90/month, MME equals 22.5   Monitoring: [place] PMP: PDMP not reviewed this encounter.       Pharmacotherapy: No side-effects or adverse reactions reported. Compliance: No problems identified. Effectiveness: Clinically acceptable.  Cindy Rose, [place]  02/06/2024  2:25 PM  Sign when Signing Visit Nursing Pain Medication Assessment:  Safety precautions to be maintained throughout the outpatient stay will include: orient to surroundings, keep bed in low position, maintain call bell within reach at all times, provide assistance with transfer out of bed and ambulation.  Medication Inspection Compliance: Pill count conducted under aseptic conditions, in front of the patient. Neither the pills nor the bottle was removed from the patient's sight at any time. Once count was completed pills were immediately returned to the patient in their original bottle.  Medication: Hydrocodone /APAP Pill/Patch Count: 15 of 90 pills/patches remain Pill/Patch Appearance: Markings consistent with prescribed medication Bottle Appearance: Standard pharmacy container. Clearly labeled. Filled Date: 09 / 25 / 2025 Last Medication intake:  Today    No results found for: CBDTHCR No results found for: D8THCCBX No results found for: D9THCCBX  UDS:  Summary  Date Value Ref Range Status  04/04/2023 FINAL  Final    Comment:    ==================================================================== [employer] [facility] 13  (MW) ==================================================================== Test  Result       Flag       Units  Drug Present and Declared for Prescription Verification   Hydrocodone                     734          EXPECTED   ng/mg creat   Norhydrocodone                 834          EXPECTED   ng/mg creat    Sources of hydrocodone  include scheduled prescription medications.    Norhydrocodone is an expected metabolite of hydrocodone .  ==================================================================== Test                      Result    Flag   Units      Ref Range   Creatinine              44               mg/dL      >=79 ==================================================================== Declared Medications:  The flagging and interpretation on this report are based on the  following declared medications.  Unexpected results may arise from  inaccuracies in the declared medications.   **Note: The testing scope of this panel includes these medications:   Hydrocodone  (Norco)   **Note: The testing scope of this panel does not include the  following reported medications:   Acetaminophen  (Norco)  Baclofen  (Lioresal )  Calcium  Gabapentin  (Neurontin )  Hydrochlorothiazide (Hydrodiuril)  Losartan (Cozaar)  Magnesium  Naproxen (Naprosyn)  Omeprazole (Prilosec)  Pravastatin (Pravachol)  Rosuvastatin (Crestor)  Valsartan (Diovan)  Zinc ==================================================================== For clinical consultation, please call [phone]. ====================================================================       ROS  Constitutional: Denies any fever or chills Gastrointestinal: No reported hemesis, hematochezia, vomiting, or acute GI distress Musculoskeletal: bilateral knee pain, low back pain Neurological: No reported episodes of acute onset apraxia, aphasia, dysarthria, agnosia, amnesia, paralysis, loss of coordination, or loss of  consciousness  Medication Review  Calcium-Magnesium-Zinc, HYDROcodone -acetaminophen , baclofen , diclofenac Sodium, gabapentin , hydrochlorothiazide, losartan, naproxen, omeprazole, pravastatin, and valsartan-hydrochlorothiazide  History Review  Allergy: Cindy Rose is allergic to ace inhibitors, other, tape, zoster vaccine live, and seasonal ic [cholestatin]. Drug: Cindy Rose  reports no history of drug use. Alcohol:  reports no history of alcohol use. Tobacco:  reports that she quit smoking about 17 years ago. Her smoking use included cigarettes. She has never used smokeless tobacco. Social: Cindy Rose  reports that she quit smoking about 17 years ago. Her smoking use included cigarettes. She has never used smokeless tobacco. She reports that she does not drink alcohol and does not use drugs. Medical:  has a past medical history of Allergy, Anxiety, Arthritis, Cancer (HCC) (09/2017), Hyperlipidemia, and Hypertension. Surgical: Cindy Rose  has a past surgical history that includes Joint replacement; Abdominal hysterectomy; Wrist surgery; and Hernia repair (2021). Family: family history includes Cancer in her father; Heart disease in her mother.  Laboratory Chemistry Profile   Renal No results found for: BUN, CREATININE, LABCREA, BCR, GFR, GFRAA, GFRNONAA, LABVMA, EPIRU, EPINEPH24HUR, NOREPRU, NOREPI24HUR, DOPARU, [id number]  Hepatic No results found for: AST, ALT, ALBUMIN, ALKPHOS, HCVAB, AMYLASE, LIPASE, AMMONIA  Electrolytes No results found for: NA, K, CL, CALCIUM, MG, PHOS  Bone No results found for: VD25OH, [id number], CI6874NY7, CI7874NY7, 25OHVITD1, 25OHVITD2, 25OHVITD3, TESTOFREE, TESTOSTERONE  Inflammation (CRP: Acute Phase) (ESR: Chronic Phase) No results found for: CRP, ESRSEDRATE, LATICACIDVEN  Note: Above Lab results reviewed.  Physical Exam  General appearance: Well nourished,  well developed, and well hydrated. In no apparent acute distress Mental status: Alert, oriented x 3 (person, place, & time)       Respiratory: No evidence of acute respiratory distress Eyes: PERLA Vitals: BP 134/74   Pulse 64   Temp 98.2 F (36.8 C) (Temporal)   Resp 18   Ht 5' 5 (1.651 m)   Wt 200 lb (90.7 kg)   SpO2 95%   BMI 33.28 kg/m  BMI: Estimated body mass index is 33.28 kg/m as calculated from the following:   Height as of this encounter: 5' 5 (1.651 m).   Weight as of this encounter: 200 lb (90.7 kg). Ideal: Ideal body weight: 57 kg (125 lb 10.6 oz) Adjusted ideal body weight: 70.5 kg (155 lb 6.4 oz) Bilateral shoulder pain Bilateral knee pain  Assessment   Diagnosis Status  1. Chronic pain syndrome   2. Medication management   3. Primary osteoarthritis of both shoulders   4. Trochanteric bursitis of right hip   5. Lumbar spondylosis   6. Chronic right shoulder pain    Controlled Controlled Controlled     Plan of Care  Continue with medication management, follow-up in 3 months  Requested Prescriptions   Signed Prescriptions Disp Refills   HYDROcodone -acetaminophen  (NORCO) 7.5-325 MG tablet 90 tablet 0    Sig: Take 1 tablet by mouth every 8 (eight) hours as needed for moderate pain (pain score 4-6). Must last 30 days   HYDROcodone -acetaminophen  (NORCO) 7.5-325 MG tablet 90 tablet 0    Sig: Take 1 tablet by mouth every 8 (eight) hours as needed for moderate pain (pain score 4-6). Must last 30 days   HYDROcodone -acetaminophen  (NORCO) 7.5-325 MG tablet 90 tablet 0    Sig: Take 1 tablet by mouth every 8 (eight) hours as needed for moderate pain (pain score 4-6). Must last 30 days  Continue gabapentin  as prescribed, no refills needed   Follow-up plan:   Return in about 3 months (around 05/08/2024) for Med mgmt.      Recent Visits No visits were found meeting these conditions. Showing recent visits within past 90 days and meeting all other  requirements Today's Visits Date Type Provider Dept  02/06/24 Office Visit Marcelino Nurse, MD [facility]  Showing today's visits and meeting all other requirements Future Appointments No visits were found meeting these conditions. Showing future appointments within next 90 days and meeting all other requirements  I discussed the assessment and treatment plan with the patient. The patient was provided an opportunity to ask questions and all were answered. The patient agreed with the plan and demonstrated an understanding of the instructions.  Patient advised to call back or seek an in-person evaluation if the symptoms or condition worsens.  I personally spent a total of 30 minutes in the care of the patient today including preparing to see the patient, getting/reviewing separately obtained history, performing a medically appropriate exam/evaluation, counseling and educating, placing orders, and documenting clinical information in the EHR.   Note by: Nurse Marcelino, MD Date: 02/06/2024; Time: 2:51 PM

## 2024-04-22 DIAGNOSIS — G894 Chronic pain syndrome: Secondary | ICD-10-CM

## 2024-04-30 ENCOUNTER — Encounter: Payer: Self-pay | Admitting: Student in an Organized Health Care Education/Training Program

## 2024-04-30 ENCOUNTER — Ambulatory Visit
Attending: Student in an Organized Health Care Education/Training Program | Admitting: Student in an Organized Health Care Education/Training Program

## 2024-04-30 VITALS — BP 137/68 | HR 62 | Temp 97.2°F | Resp 18 | Ht 65.0 in | Wt 200.0 lb

## 2024-04-30 DIAGNOSIS — M47816 Spondylosis without myelopathy or radiculopathy, lumbar region: Secondary | ICD-10-CM | POA: Insufficient documentation

## 2024-04-30 DIAGNOSIS — G894 Chronic pain syndrome: Secondary | ICD-10-CM | POA: Diagnosis present

## 2024-04-30 DIAGNOSIS — Z79899 Other long term (current) drug therapy: Secondary | ICD-10-CM | POA: Insufficient documentation

## 2024-04-30 DIAGNOSIS — G8929 Other chronic pain: Secondary | ICD-10-CM | POA: Insufficient documentation

## 2024-04-30 DIAGNOSIS — M19011 Primary osteoarthritis, right shoulder: Secondary | ICD-10-CM | POA: Insufficient documentation

## 2024-04-30 DIAGNOSIS — M25511 Pain in right shoulder: Secondary | ICD-10-CM | POA: Insufficient documentation

## 2024-04-30 DIAGNOSIS — M7061 Trochanteric bursitis, right hip: Secondary | ICD-10-CM | POA: Insufficient documentation

## 2024-04-30 DIAGNOSIS — M19012 Primary osteoarthritis, left shoulder: Secondary | ICD-10-CM | POA: Insufficient documentation

## 2024-04-30 DIAGNOSIS — M7918 Myalgia, other site: Secondary | ICD-10-CM | POA: Insufficient documentation

## 2024-04-30 MED ORDER — ROPIVACAINE HCL 2 MG/ML IJ SOLN
4.0000 mL | Freq: Once | INTRAMUSCULAR | Status: AC
  Administered 2024-04-30: 4 mL via INTRA_ARTICULAR

## 2024-04-30 MED ORDER — ROPIVACAINE HCL 2 MG/ML IJ SOLN
INTRAMUSCULAR | Status: AC
  Filled 2024-04-30: qty 20

## 2024-04-30 MED ORDER — HYDROCODONE-ACETAMINOPHEN 7.5-325 MG PO TABS: 1.0000 | ORAL_TABLET | Freq: Three times a day (TID) | ORAL | 0 refills | Status: AC | PRN

## 2024-04-30 MED ORDER — BACLOFEN 10 MG PO TABS: 10.0000 mg | ORAL_TABLET | Freq: Two times a day (BID) | ORAL | 5 refills | Status: AC | PRN

## 2024-04-30 MED ORDER — DEXAMETHASONE SOD PHOSPHATE PF 10 MG/ML IJ SOLN
INTRAMUSCULAR | Status: AC
  Filled 2024-04-30: qty 1

## 2024-04-30 MED ORDER — GABAPENTIN 300 MG PO CAPS: 300.0000 mg | ORAL_CAPSULE | Freq: Three times a day (TID) | ORAL | 5 refills | Status: AC

## 2024-04-30 MED ORDER — DEXAMETHASONE SOD PHOSPHATE PF 10 MG/ML IJ SOLN
10.0000 mg | Freq: Once | INTRAMUSCULAR | Status: AC
  Administered 2024-04-30: 10 mg

## 2024-04-30 NOTE — Progress Notes (Signed)
 PROVIDER NOTE: Information contained herein reflects review and annotations entered in association with encounter. Interpretation of such information and data should be left to medically-trained personnel. Information provided to patient can be located elsewhere in the medical record under Patient Instructions. Document created using STT-dictation technology, any transcriptional errors that may result from process are unintentional.    Patient: Cindy Rose  Service Category: E/M  Provider: Wallie Sherry, MD  DOB: 09-Nov-1945  DOS: 04/30/2024  Referring Provider: Arlyss Fallow, MD  MRN: [id number]  Specialty: Interventional Pain Management  PCP: Arlyss Fallow, MD  Type: Established Patient  Setting: Ambulatory outpatient    Location: Office  Delivery: Face-to-face     HPI  [name], a 79 y.o. year old female, is here today because of her Myofascial pain [M79.18]. [name]'s primary complain today is Back Pain (L>R)  Pertinent problems: [name] has Primary osteoarthritis of both knees; Chronic pain syndrome; History of bilateral knee replacement; Anxiety state; Myofascial pain; Lumbar spondylosis; Bilateral hearing loss; GERD (gastroesophageal reflux disease); Spondylosis of lumbar region without myelopathy or radiculopathy; and Lumbar degenerative disc disease on their pertinent problem list. Pain Assessment: Severity of Chronic pain is reported as a 8 /10. Location: Back Mid, Left/Can radaite to right side. Onset: More than a month ago. Quality: Constant, Sharp (pinch). Timing: Constant. Modifying factor(s): Pain medication. Vitals:  height is 5' 5 (1.651 m) and weight is 200 lb (90.7 kg). Her temporal temperature is 97.2 F (36.2 C) (abnormal). Her blood pressure is 137/68 and her pulse is 62. Her respiration is 18 and oxygen saturation is 98%.  BMI: Estimated body mass index is 33.28 kg/m as calculated from the following:   Height as of this encounter: 5' 5 (1.651  m).   Weight as of this encounter: 200 lb (90.7 kg). Last encounter: 04/04/2023 Last procedure: Visit date not found.  Reason for encounter: medication management.  No change in medical history since last visit.  P  Patient continues multimodal pain regimen as prescribed.  States that it provides pain relief and improvement in functional status. Patient continues to work 3 days a week. Patient states that she has a very tender spot in her lateral thoracic spine inferior to her scapular border.  We discussed a trigger point injection in this region  Pharmacotherapy Assessment  Analgesic: Hydrocodone  7.5 mg 3 times daily PRN, quantity 90/month, MME equals 22.5   Monitoring: [place] PMP: PDMP reviewed during this encounter.       Pharmacotherapy: No side-effects or adverse reactions reported. Compliance: No problems identified. Effectiveness: Clinically acceptable.  Bonner Norris, RN  04/30/2024  2:08 PM  Sign when Signing Visit Safety precautions to be maintained throughout the outpatient stay will include: orient to surroundings, keep bed in low position, maintain call bell within reach at all times, provide assistance with transfer out of bed and ambulation.     Nursing Pain Medication Assessment:  Safety precautions to be maintained throughout the outpatient stay will include: orient to surroundings, keep bed in low position, maintain call bell within reach at all times, provide assistance with transfer out of bed and ambulation.  Medication Inspection Compliance: Pill count conducted under aseptic conditions, in front of the patient. Neither the pills nor the bottle was removed from the patient's sight at any time. Once count was completed pills were immediately returned to the patient in their original bottle.  Medication: Hydrocodone /APAP Pill/Patch Count: 43 of 90 pills/patches remain Pill/Patch Appearance: Markings consistent with prescribed medication Bottle Appearance:  Standard  pharmacy container. Clearly labeled. Filled Date: 49 / 23 / 2025 Last Medication intake:  Today  No results found for: CBDTHCR No results found for: D8THCCBX No results found for: D9THCCBX  UDS:  Summary  Date Value Ref Range Status  04/04/2023 FINAL  Final    Comment:    ==================================================================== [employer] Select 13 (MW) ==================================================================== Test                             Result       Flag       Units  Drug Present and Declared for Prescription Verification   Hydrocodone                     734          EXPECTED   ng/mg creat   Norhydrocodone                 834          EXPECTED   ng/mg creat    Sources of hydrocodone  include scheduled prescription medications.    Norhydrocodone is an expected metabolite of hydrocodone .  ==================================================================== Test                      Result    Flag   Units      Ref Range   Creatinine              44               mg/dL      >=79 ==================================================================== Declared Medications:  The flagging and interpretation on this report are based on the  following declared medications.  Unexpected results may arise from  inaccuracies in the declared medications.   **Note: The testing scope of this panel includes these medications:   Hydrocodone  (Norco)   **Note: The testing scope of this panel does not include the  following reported medications:   Acetaminophen  (Norco)  Baclofen  (Lioresal )  Calcium  Gabapentin  (Neurontin )  Hydrochlorothiazide (Hydrodiuril)  Losartan (Cozaar)  Magnesium  Naproxen (Naprosyn)  Omeprazole (Prilosec)  Pravastatin (Pravachol)  Rosuvastatin (Crestor)  Valsartan (Diovan)  Zinc ==================================================================== For clinical consultation, please call [phone]. ====================================================================       ROS  Constitutional: Denies any fever or chills Gastrointestinal: No reported hemesis, hematochezia, vomiting, or acute GI distress Musculoskeletal: Left thoracic, periscapular pain Neurological: No reported episodes of acute onset apraxia, aphasia, dysarthria, agnosia, amnesia, paralysis, loss of coordination, or loss of consciousness  Medication Review  Calcium-Magnesium-Zinc, HYDROcodone -acetaminophen , baclofen , diclofenac Sodium, gabapentin , hydrochlorothiazide, losartan, naproxen, omeprazole, rosuvastatin, and valsartan-hydrochlorothiazide  History Review  Allergy: Ms. Tye is allergic to ace inhibitors, other, tape, zoster vaccine live, and seasonal ic [cholestatin]. Drug: Ms. Westby  reports no history of drug use. Alcohol:  reports no history of alcohol use. Tobacco:  reports that she quit smoking about 17 years ago. Her smoking use included cigarettes. She has never used smokeless tobacco. Social: Ms. Surges  reports that she quit smoking about 17 years ago. Her smoking use included cigarettes. She has never used smokeless tobacco. She reports that she does not drink alcohol and does not use drugs. Medical:  has a past medical history of Allergy, Anxiety, Arthritis, Cancer (HCC) (09/2017), Hyperlipidemia, and Hypertension. Surgical: Ms. Seward  has a past surgical history that includes Joint replacement; Abdominal hysterectomy; Wrist surgery; and Hernia repair (2021). Family: family history includes Cancer  in her father; Heart disease in her mother.  Laboratory Chemistry Profile   Renal No results found for: BUN, CREATININE, LABCREA, BCR, GFR, GFRAA, GFRNONAA, LABVMA, EPIRU, EPINEPH24HUR, NOREPRU, NOREPI24HUR, DOPARU, INEJF75YMLM  Hepatic No results found for: AST, ALT, ALBUMIN, ALKPHOS, HCVAB, AMYLASE, LIPASE, AMMONIA  Electrolytes No  results found for: NA, K, CL, CALCIUM, MG, PHOS  Bone No results found for: VD25OH, CI874NY7UNU, CI6874NY7, CI7874NY7, 25OHVITD1, 25OHVITD2, 25OHVITD3, TESTOFREE, TESTOSTERONE  Inflammation (CRP: Acute Phase) (ESR: Chronic Phase) No results found for: CRP, ESRSEDRATE, LATICACIDVEN       Note: Above Lab results reviewed.  Physical Exam  General appearance: Well nourished, well developed, and well hydrated. In no apparent acute distress Mental status: Alert, oriented x 3 (person, place, & time)       Respiratory: No evidence of acute respiratory distress Eyes: PERLA Vitals: BP 137/68 (Patient Position: Sitting, Cuff Size: Normal)   Pulse 62   Temp (!) 97.2 F (36.2 C) (Temporal)   Resp 18   Ht 5' 5 (1.651 m)   Wt 200 lb (90.7 kg)   SpO2 98%   BMI 33.28 kg/m  BMI: Estimated body mass index is 33.28 kg/m as calculated from the following:   Height as of this encounter: 5' 5 (1.651 m).   Weight as of this encounter: 200 lb (90.7 kg). Ideal: Ideal body weight: 57 kg (125 lb 10.6 oz) Adjusted ideal body weight: 70.5 kg (155 lb 6.4 oz) Left thoracic and periscapular pain Assessment   Diagnosis  1. Myofascial pain   2. Lumbar spondylosis   3. Chronic right shoulder pain   4. Medication management   5. Primary osteoarthritis of both shoulders   6. Chronic pain syndrome   7. Trochanteric bursitis of right hip        Plan of Care  Continue with medication management, follow-up in 3 months   A trigger point injection was also performed in the left inferior periscapular thoracic region.  2 trigger points were injected each with a solution containing 3 cc of 0.2% ropivacaine , 1 cc of Decadron  10 mg/cc.  1.5 cc injected into each trigger point  Requested Prescriptions   Signed Prescriptions Disp Refills   HYDROcodone -acetaminophen  (NORCO) 7.5-325 MG tablet 90 tablet 0    Sig: Take 1 tablet by mouth every 8 (eight) hours as needed for moderate  pain (pain score 4-6). Must last 30 days   HYDROcodone -acetaminophen  (NORCO) 7.5-325 MG tablet 90 tablet 0    Sig: Take 1 tablet by mouth every 8 (eight) hours as needed for moderate pain (pain score 4-6). Must last 30 days   HYDROcodone -acetaminophen  (NORCO) 7.5-325 MG tablet 90 tablet 0    Sig: Take 1 tablet by mouth every 8 (eight) hours as needed for moderate pain (pain score 4-6). Must last 30 days   gabapentin  (NEURONTIN ) 300 MG capsule 90 capsule 5    Sig: Take 1 capsule (300 mg total) by mouth 3 (three) times daily.   baclofen  (LIORESAL ) 10 MG tablet 60 tablet 5    Sig: Take 1 tablet (10 mg total) by mouth 2 (two) times daily as needed for muscle spasms.     Follow-up plan:   Return in about 3 months (around 07/29/2024) for MM, F2F.      Recent Visits Date Type Provider Dept  02/06/24 Office Visit Marcelino Nurse, MD [facility]  Showing recent visits within past 90 days and meeting all other requirements Today's Visits Date Type Provider Dept  04/30/24 Office Visit  Marcelino Nurse, MD [facility]  Showing today's visits and meeting all other requirements Future Appointments No visits were found meeting these conditions. Showing future appointments within next 90 days and meeting all other requirements  I discussed the assessment and treatment plan with the patient. The patient was provided an opportunity to ask questions and all were answered. The patient agreed with the plan and demonstrated an understanding of the instructions.  Patient advised to call back or seek an in-person evaluation if the symptoms or condition worsens.  I personally spent a total of 30 minutes in the care of the patient today including preparing to see the patient, getting/reviewing separately obtained history, performing a medically appropriate exam/evaluation, counseling and educating, placing orders, and documenting clinical information in the EHR.   Note by: Nurse Marcelino,  MD Date: 04/30/2024; Time: 4:35 PM

## 2024-04-30 NOTE — Progress Notes (Signed)
 Safety precautions to be maintained throughout the outpatient stay will include: orient to surroundings, keep bed in low position, maintain call bell within reach at all times, provide assistance with transfer out of bed and ambulation.     Nursing Pain Medication Assessment:  Safety precautions to be maintained throughout the outpatient stay will include: orient to surroundings, keep bed in low position, maintain call bell within reach at all times, provide assistance with transfer out of bed and ambulation.  Medication Inspection Compliance: Pill count conducted under aseptic conditions, in front of the patient. Neither the pills nor the bottle was removed from the patient's sight at any time. Once count was completed pills were immediately returned to the patient in their original bottle.  Medication: Hydrocodone /APAP Pill/Patch Count: 43 of 90 pills/patches remain Pill/Patch Appearance: Markings consistent with prescribed medication Bottle Appearance: Standard pharmacy container. Clearly labeled. Filled Date: 61 / 23 / 2025 Last Medication intake:  Today

## 2024-08-06 ENCOUNTER — Encounter: Admitting: Student in an Organized Health Care Education/Training Program
